# Patient Record
Sex: Female | Born: 1970 | Race: White | Hispanic: No | Marital: Married | Smoking: Former smoker
Health system: Southern US, Community
[De-identification: ages and names within clinical notes are randomized; demographics above are authoritative.]

## PROBLEM LIST (undated history)

## (undated) DIAGNOSIS — M549 Dorsalgia, unspecified: Secondary | ICD-10-CM

## (undated) DIAGNOSIS — G8929 Other chronic pain: Secondary | ICD-10-CM

## (undated) DIAGNOSIS — K5792 Diverticulitis of intestine, part unspecified, without perforation or abscess without bleeding: Secondary | ICD-10-CM

## (undated) DIAGNOSIS — Z8489 Family history of other specified conditions: Secondary | ICD-10-CM

## (undated) DIAGNOSIS — R1115 Cyclical vomiting syndrome unrelated to migraine: Secondary | ICD-10-CM

## (undated) DIAGNOSIS — K649 Unspecified hemorrhoids: Secondary | ICD-10-CM

## (undated) HISTORY — PX: HEMORRHOID SURGERY: SHX153

## (undated) HISTORY — PX: APPENDECTOMY: SHX54

## (undated) HISTORY — PX: CHOLECYSTECTOMY: SHX55

## (undated) HISTORY — PX: BACK SURGERY: SHX140

## (undated) HISTORY — PX: ABDOMINAL HYSTERECTOMY: SHX81

---
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
## 2003-11-08 ENCOUNTER — Other Ambulatory Visit: Admission: RE | Admit: 2003-11-08 | Discharge: 2003-11-08 | Payer: Self-pay | Admitting: *Deleted

## 2003-12-07 ENCOUNTER — Emergency Department (HOSPITAL_COMMUNITY): Admission: EM | Admit: 2003-12-07 | Discharge: 2003-12-07 | Payer: Self-pay | Admitting: *Deleted

## 2004-01-03 ENCOUNTER — Emergency Department (HOSPITAL_COMMUNITY): Admission: EM | Admit: 2004-01-03 | Discharge: 2004-01-03 | Payer: Self-pay | Admitting: Emergency Medicine

## 2004-06-22 ENCOUNTER — Emergency Department (HOSPITAL_COMMUNITY): Admission: EM | Admit: 2004-06-22 | Discharge: 2004-06-22 | Payer: Self-pay | Admitting: Family Medicine

## 2005-06-25 ENCOUNTER — Inpatient Hospital Stay (HOSPITAL_COMMUNITY): Admission: RE | Admit: 2005-06-25 | Discharge: 2005-06-26 | Payer: Self-pay | Admitting: *Deleted

## 2005-06-27 ENCOUNTER — Inpatient Hospital Stay (HOSPITAL_COMMUNITY): Admission: AD | Admit: 2005-06-27 | Discharge: 2005-06-29 | Payer: Self-pay | Admitting: Obstetrics and Gynecology

## 2005-07-25 ENCOUNTER — Observation Stay (HOSPITAL_COMMUNITY): Admission: EM | Admit: 2005-07-25 | Discharge: 2005-07-25 | Payer: Self-pay | Admitting: Emergency Medicine

## 2007-03-09 IMAGING — CR DG ABDOMEN ACUTE W/ 1V CHEST
3 series · 3 of 3 positions shown · non-contrast
Comparison: None.

CLINICAL DATA: 34-year-old female status-post hysterectomy with vomiting and abdominal pain.  
ACUTE ABDOMINAL SERIES ? 3 VIEW:

[view not recorded (1 of 3)]
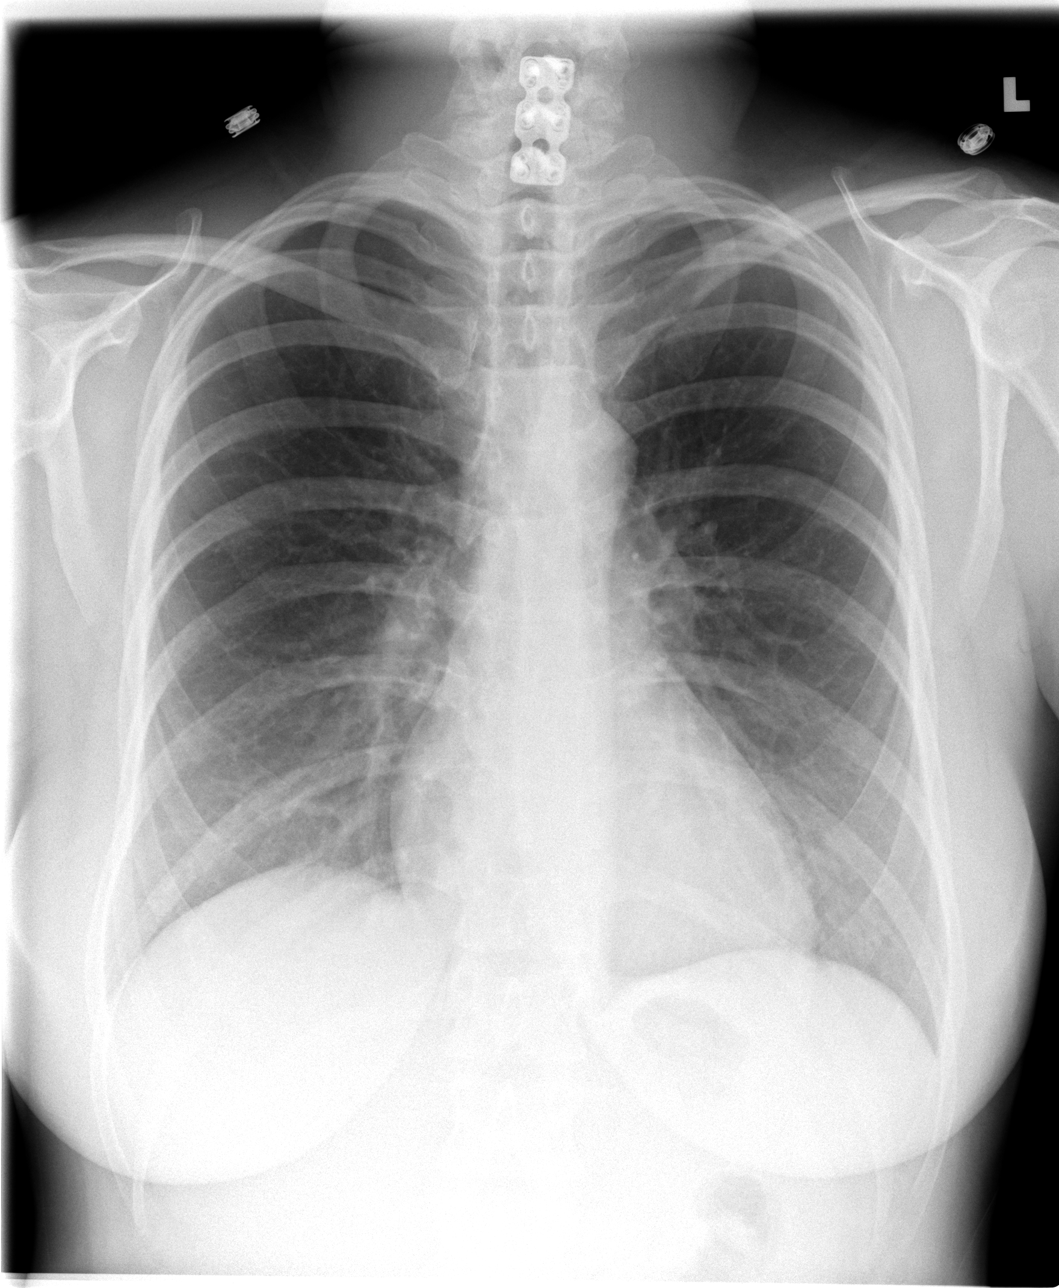

[view not recorded (2 of 3)]
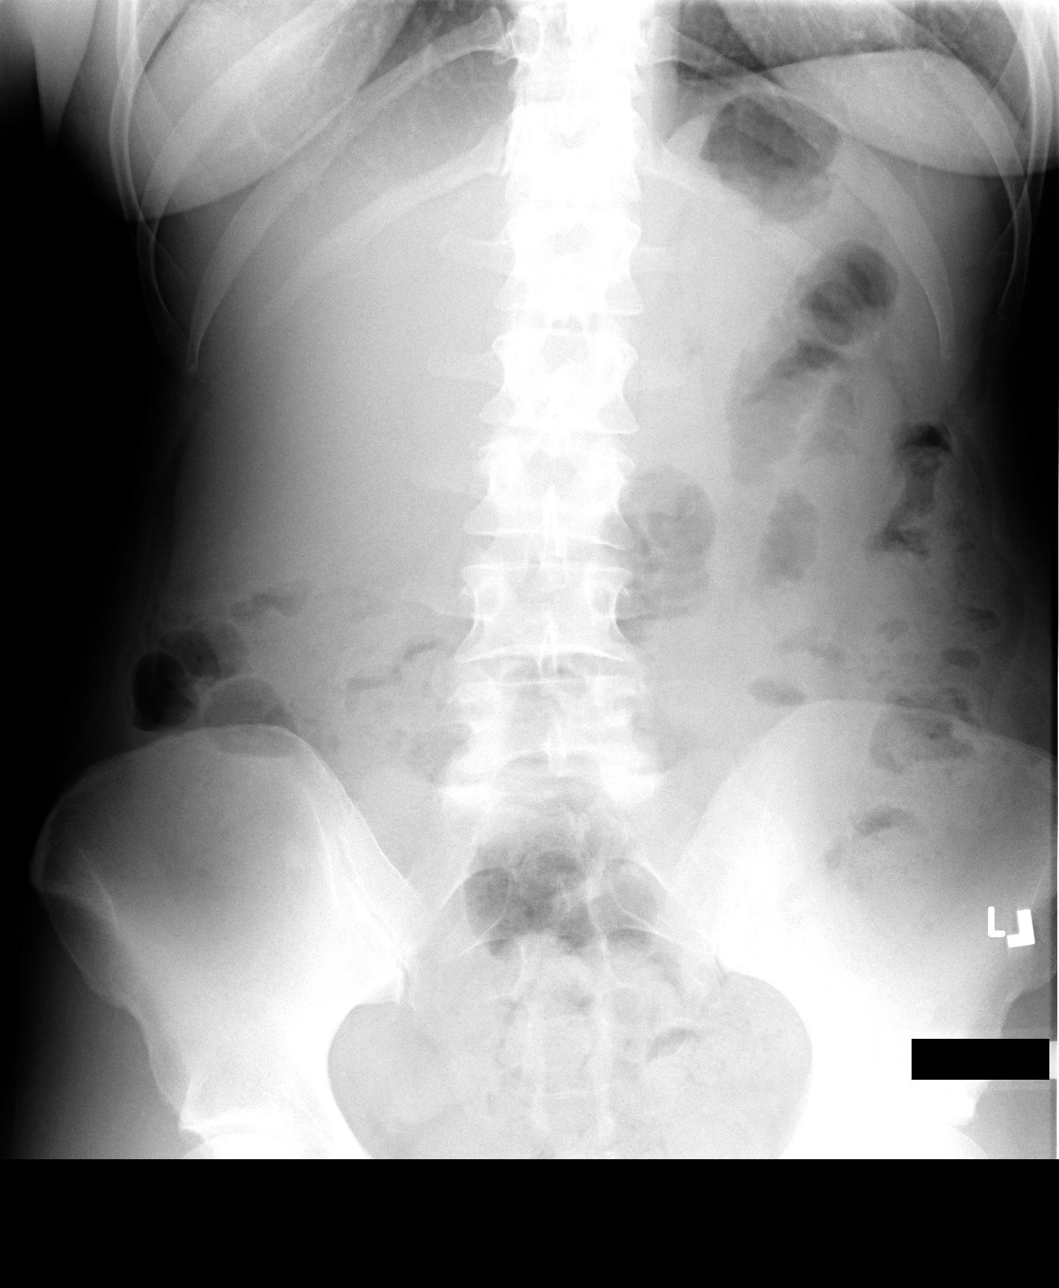

[view not recorded (3 of 3)]
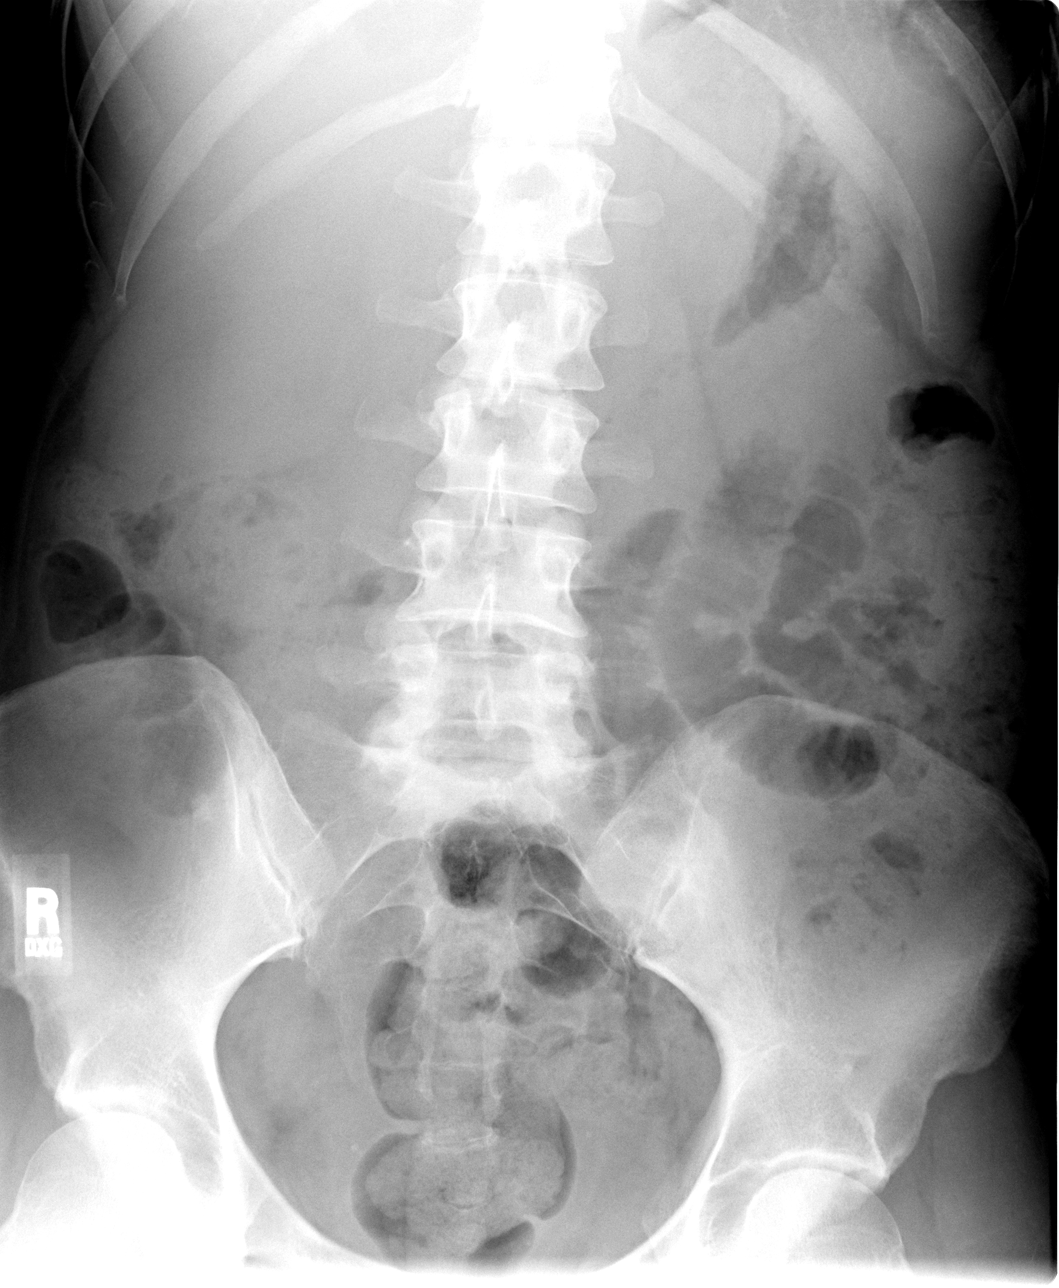

[3 of 3 positions shown; findings below may reference images not displayed]

CHEST:  
Lungs are clear.  Normal heart size.  No visualized free air.  Cervical spinal fusion hardware is noted.  
ABDOMEN:
Scattered air and stool is present throughout the bowel.  Mild distention of the small bowel in the left abdomen.  No definite obstruction.  Mild constipation.
IMPRESSION: 1.  No acute chest finding. 
2.  No obstruction.
3.  Constipation.

## 2013-01-07 ENCOUNTER — Other Ambulatory Visit: Payer: Self-pay | Admitting: Neurosurgery

## 2013-01-07 ENCOUNTER — Ambulatory Visit
Admission: RE | Admit: 2013-01-07 | Discharge: 2013-01-07 | Disposition: A | Discharge status: Home / Self Care | Payer: Medicare Other | Source: Ambulatory Visit | Attending: Neurosurgery | Admitting: Neurosurgery

## 2013-01-07 DIAGNOSIS — M47816 Spondylosis without myelopathy or radiculopathy, lumbar region: Secondary | ICD-10-CM

## 2013-06-22 ENCOUNTER — Other Ambulatory Visit: Payer: Self-pay | Admitting: Neurosurgery

## 2013-06-22 DIAGNOSIS — M542 Cervicalgia: Secondary | ICD-10-CM

## 2013-06-28 ENCOUNTER — Ambulatory Visit
Admission: RE | Admit: 2013-06-28 | Discharge: 2013-06-28 | Disposition: A | Discharge status: Home / Self Care | Payer: Medicare Other | Source: Ambulatory Visit | Attending: Neurosurgery | Admitting: Neurosurgery

## 2013-06-28 DIAGNOSIS — M542 Cervicalgia: Secondary | ICD-10-CM

## 2014-06-04 ENCOUNTER — Emergency Department (HOSPITAL_BASED_OUTPATIENT_CLINIC_OR_DEPARTMENT_OTHER): Payer: Medicare Other

## 2014-06-04 ENCOUNTER — Emergency Department (HOSPITAL_BASED_OUTPATIENT_CLINIC_OR_DEPARTMENT_OTHER)
Admission: EM | Admit: 2014-06-04 | Discharge: 2014-06-04 | Disposition: A | Discharge status: Home / Self Care | Payer: Medicare Other | Attending: Emergency Medicine | Admitting: Emergency Medicine

## 2014-06-04 ENCOUNTER — Encounter (HOSPITAL_BASED_OUTPATIENT_CLINIC_OR_DEPARTMENT_OTHER): Payer: Self-pay | Admitting: Emergency Medicine

## 2014-06-04 DIAGNOSIS — F17200 Nicotine dependence, unspecified, uncomplicated: Secondary | ICD-10-CM | POA: Insufficient documentation

## 2014-06-04 DIAGNOSIS — G8929 Other chronic pain: Secondary | ICD-10-CM | POA: Insufficient documentation

## 2014-06-04 DIAGNOSIS — K5732 Diverticulitis of large intestine without perforation or abscess without bleeding: Secondary | ICD-10-CM

## 2014-06-04 DIAGNOSIS — Z79899 Other long term (current) drug therapy: Secondary | ICD-10-CM | POA: Insufficient documentation

## 2014-06-04 HISTORY — DX: Dorsalgia, unspecified: M54.9

## 2014-06-04 HISTORY — DX: Other chronic pain: G89.29

## 2014-06-04 LAB — BASIC METABOLIC PANEL
BUN: 14 mg/dL (ref 6–23)
CO2: 25 mEq/L (ref 19–32)
Calcium: 9.4 mg/dL (ref 8.4–10.5)
Chloride: 107 mEq/L (ref 96–112)
Creatinine, Ser: 0.6 mg/dL (ref 0.50–1.10)
GFR calc Af Amer: >90 mL/min (ref >90)
GFR calc non Af Amer: >90 mL/min (ref >90)
Glucose, Bld: 107 mg/dL — ABNORMAL HIGH (ref 70–99)
Potassium: 3.6 mEq/L — ABNORMAL LOW (ref 3.7–5.3)
Sodium: 144 mEq/L (ref 137–147)

## 2014-06-04 LAB — URINALYSIS, ROUTINE W REFLEX MICROSCOPIC
Glucose, UA: NEGATIVE mg/dL
Hgb urine dipstick: NEGATIVE
Ketones, ur: 15 mg/dL — AB
Leukocytes, UA: NEGATIVE
Nitrite: NEGATIVE
Protein, ur: NEGATIVE mg/dL
Specific Gravity, Urine: 1.027 (ref 1.005–1.030)
Urobilinogen, UA: 1 mg/dL (ref 0.0–1.0)
pH: 6.5 (ref 5.0–8.0)

## 2014-06-04 LAB — HEPATIC FUNCTION PANEL
ALT: 13 U/L (ref 0–35)
AST: 15 U/L (ref 0–37)
Albumin: 3.9 g/dL (ref 3.5–5.2)
Alkaline Phosphatase: 47 U/L (ref 39–117)
Bilirubin, Direct: <0.2 mg/dL (ref 0.0–0.3)
Total Bilirubin: 0.4 mg/dL (ref 0.3–1.2)
Total Protein: 6.9 g/dL (ref 6.0–8.3)

## 2014-06-04 LAB — CBC WITH DIFFERENTIAL/PLATELET
Basophils Absolute: 0 10*3/uL (ref 0.0–0.1)
Basophils Relative: 0 % (ref 0–1)
Eosinophils Absolute: 0.1 10*3/uL (ref 0.0–0.7)
Eosinophils Relative: 1 % (ref 0–5)
HCT: 36.9 % (ref 36.0–46.0)
Hemoglobin: 12.2 g/dL (ref 12.0–15.0)
Lymphocytes Relative: 16 % (ref 12–46)
Lymphs Abs: 2.2 10*3/uL (ref 0.7–4.0)
MCH: 32.7 pg (ref 26.0–34.0)
MCHC: 33.1 g/dL (ref 30.0–36.0)
MCV: 98.9 fL (ref 78.0–100.0)
Monocytes Absolute: 1 10*3/uL (ref 0.1–1.0)
Monocytes Relative: 7 % (ref 3–12)
Neutro Abs: 10.5 10*3/uL — ABNORMAL HIGH (ref 1.7–7.7)
Neutrophils Relative %: 76 % (ref 43–77)
Platelets: 296 10*3/uL (ref 150–400)
RBC: 3.73 MIL/uL — ABNORMAL LOW (ref 3.87–5.11)
RDW: 14.4 % (ref 11.5–15.5)
WBC: 13.8 10*3/uL — ABNORMAL HIGH (ref 4.0–10.5)

## 2014-06-04 MED ORDER — HYDROCODONE-ACETAMINOPHEN 5-325 MG PO TABS
1.0000 | ORAL_TABLET | Freq: Four times a day (QID) | ORAL | Status: DC | PRN
Start: 2014-06-04 — End: 2014-11-14

## 2014-06-04 MED ORDER — KETOROLAC TROMETHAMINE 30 MG/ML IJ SOLN
30.0000 mg | Freq: Once | INTRAMUSCULAR | Status: AC
  Administered 2014-06-04: 30 mg via INTRAVENOUS
  Filled 2014-06-04: qty 1

## 2014-06-04 MED ORDER — METRONIDAZOLE 500 MG PO TABS
500.0000 mg | ORAL_TABLET | Freq: Two times a day (BID) | ORAL | Status: DC
Start: 2014-06-04 — End: 2014-11-14

## 2014-06-04 MED ORDER — CIPROFLOXACIN IN D5W 400 MG/200ML IV SOLN
400.0000 mg | Freq: Once | INTRAVENOUS | Status: AC
  Administered 2014-06-04: 400 mg via INTRAVENOUS
  Filled 2014-06-04: qty 200

## 2014-06-04 MED ORDER — SODIUM CHLORIDE 0.9 % IV BOLUS (SEPSIS)
1000.0000 mL | Freq: Once | INTRAVENOUS | Status: AC
  Administered 2014-06-04: 1000 mL via INTRAVENOUS

## 2014-06-04 MED ORDER — HYDROMORPHONE HCL PF 1 MG/ML IJ SOLN
0.5000 mg | Freq: Once | INTRAMUSCULAR | Status: AC
  Administered 2014-06-04: 0.5 mg via INTRAVENOUS
  Filled 2014-06-04: qty 1

## 2014-06-04 MED ORDER — CIPROFLOXACIN HCL 500 MG PO TABS
500.0000 mg | ORAL_TABLET | Freq: Two times a day (BID) | ORAL | Status: DC
Start: 2014-06-04 — End: 2014-11-14

## 2014-06-04 MED ORDER — METRONIDAZOLE IN NACL 5-0.79 MG/ML-% IV SOLN
500.0000 mg | Freq: Once | INTRAVENOUS | Status: AC
  Administered 2014-06-04: 500 mg via INTRAVENOUS
  Filled 2014-06-04: qty 100

## 2014-06-04 MED ORDER — HYDROMORPHONE HCL PF 1 MG/ML IJ SOLN
0.5000 mg | INTRAMUSCULAR | Status: DC | PRN
  Administered 2014-06-04: 0.5 mg via INTRAVENOUS
  Filled 2014-06-04: qty 1

## 2014-06-04 NOTE — ED Notes (Signed)
Lower abdominal pain woke her at 4am. States it feels like a pressure. No nausea no vomiting. Denies vaginal discharge.

## 2014-06-04 NOTE — ED Provider Notes (Signed)
CSN: [id number]     Arrival date & time 06/04/14  1352 History   First MD Initiated Contact with Patient 06/04/14 1413     Chief Complaint  Patient presents with  . Abdominal Pain      HPI  Patient presents with acute onset of lower abdominal pain.  The pain is focally in the mid to lower abdomen.  Pain is sore, severe, nonradiating. There is no associated vomiting, diarrhea, vaginal discharge or bleeding. No relief with OTC medication. Pain is incapacitating. No clear precipitant. No associated chest pain, dyspnea. Patient has history of total abdominal hysterectomy, cholecystectomy, appendectomy.   Past Medical History  Diagnosis Date  . Chronic back pain    Past Surgical History  Procedure Laterality Date  . Abdominal hysterectomy    . Back surgery     No family history on file. History  Substance Use Topics  . Smoking status: Current Every Day Smoker -- 1.00 packs/day    Types: Cigarettes  . Smokeless tobacco: Not on file  . Alcohol Use: No   OB History   Grav Para Term Preterm Abortions TAB SAB Ect Mult Living                 Review of Systems  Constitutional:       Per HPI, otherwise negative  HENT:       Per HPI, otherwise negative  Respiratory:       Per HPI, otherwise negative  Cardiovascular:       Per HPI, otherwise negative  Gastrointestinal: Negative for vomiting.  Endocrine:       Negative aside from HPI  Genitourinary:       Neg aside from HPI   Musculoskeletal:       Per HPI, otherwise negative  Skin: Negative.   Neurological: Negative for syncope.      Allergies  Sulfa antibiotics and Biaxin  Home Medications   Prior to Admission medications   Medication Sig Start Date End Date Taking? Authorizing Nahshon Reich  Cyclobenzaprine HCl (FLEXERIL PO) Take by mouth.   Yes Historical Clide Remmers, MD  Hydrocodone-Acetaminophen (VICODIN PO) Take by mouth.   Yes Historical Zaccheus Edmister, MD  Naproxen (NAPROSYN PO) Take by mouth.   Yes Historical  Aela Bohan, MD   BP 117/82  Pulse 101  Temp(Src) 99.5 F (37.5 C) (Oral)  Resp 20  Ht 5\' 6"  (1.676 m)  Wt 172 lb (78.019 kg)  BMI 27.77 kg/m2  SpO2 98% Physical Exam  Nursing note and vitals reviewed. Constitutional: She is oriented to person, place, and time. She appears well-developed and well-nourished. No distress.  HENT:  Head: Normocephalic and atraumatic.  Eyes: Conjunctivae and EOM are normal.  Cardiovascular: Normal rate and regular rhythm.   Pulmonary/Chest: Effort normal and breath sounds normal. No stridor. No respiratory distress.  Abdominal: She exhibits no distension. There is tenderness in the suprapubic area. There is guarding. There is no rigidity and no rebound.    Musculoskeletal: She exhibits no edema.  Neurological: She is alert and oriented to person, place, and time. No cranial nerve deficit.  Skin: Skin is warm and dry.  Psychiatric: She has a normal mood and affect.    ED Course  Procedures (including critical care time) Labs Review Labs Reviewed  URINALYSIS, ROUTINE W REFLEX MICROSCOPIC - Abnormal; Notable for the following:    Color, Urine AMBER (*)    APPearance CLOUDY (*)    Bilirubin Urine SMALL (*)    Ketones, ur 15 (*)  All other components within normal limits  CBC WITH DIFFERENTIAL - Abnormal; Notable for the following:    WBC 13.8 (*)    RBC 3.73 (*)    Neutro Abs 10.5 (*)    All other components within normal limits  BASIC METABOLIC PANEL  HEPATIC FUNCTION PANEL    Imaging Review Ct Abdomen Pelvis Wo Contrast  06/04/2014   CLINICAL DATA:  43 year old female with abdominal and pelvic pain and pressure.  EXAM: CT ABDOMEN AND PELVIS WITHOUT CONTRAST  TECHNIQUE: Multidetector CT imaging of the abdomen and pelvis was performed following the standard protocol without IV contrast.  COMPARISON:  06/28/2005 CT  FINDINGS: Bilateral breast prosthesis and mild bibasilar pulmonary scarring noted.  The liver, spleen, adrenal glands, kidneys and  pancreas are unremarkable.  Please note that parenchymal abnormalities may be missed without intravenous contrast.  The patient is status post cholecystectomy and hysterectomy.  Mild focal wall thickening of the mid sigmoid colon with adjacent inflammation is compatible with diverticulitis. No definite focal collection/abscess, pneumoperitoneum or bowel obstruction identified.  Scattered diverticula throughout the transverse, descending and sigmoid colon identified.  There is no evidence of free fluid, enlarged lymph nodes, biliary dilation or abdominal aortic aneurysm.  Posterior lumbar spine fusion changes and hardware identified from L2-S1.  No acute bony abnormalities are identified.  IMPRESSION: Diverticulitis involving the mid sigmoid colon. No evidence of focal abscess or pneumoperitoneum. Clinical followup recommended.   Electronically Signed   By: Laveda AbbeJeff  Hu M.D.   On: 06/04/2014 15:05   3:20 PM On repeat exam the patient appears more calm, pain has improved substantially. Discuss all results.  Patient will receive initial hematocrit here, but discharged to followup with GI. MDM  Patient presents no lower abdominal pain, is found to have diverticulitis.  Patient afebrile, in no distress, had pain controlled here.  The patient was started on antibiotics, discharged to follow up with GI.    Gerhard Munchobert Lockwood, MD 06/04/14 1520

## 2014-06-04 NOTE — Discharge Instructions (Signed)
Please be sure to take all medication as directed, and follow up with our gastroenterologist after he completes her antibiotics.  Return here for concerning changes in your condition.   Diverticulitis Diverticulitis is inflammation or infection of small pouches in your colon that form when you have a condition called diverticulosis. The pouches in your colon are called diverticula. Your colon, or large intestine, is where water is absorbed and stool is formed. Complications of diverticulitis can include:  Bleeding.  Severe infection.  Severe pain.  Perforation of your colon.  Obstruction of your colon. CAUSES  Diverticulitis is caused by bacteria. Diverticulitis happens when stool becomes trapped in diverticula. This allows bacteria to grow in the diverticula, which can lead to inflammation and infection. RISK FACTORS People with diverticulosis are at risk for diverticulitis. Eating a diet that does not include enough fiber from fruits and vegetables may make diverticulitis more likely to develop. SYMPTOMS  Symptoms of diverticulitis may include:  Abdominal pain and tenderness. The pain is normally located on the left side of the abdomen, but may occur in other areas.  Fever and chills.  Bloating.  Cramping.  Nausea.  Vomiting.  Constipation.  Diarrhea.  Blood in your stool. DIAGNOSIS  Your health care provider will ask you about your medical history and do a physical exam. You may need to have tests done because many medical conditions can cause the same symptoms as diverticulitis. Tests may include:  Blood tests.  Urine tests.  Imaging tests of the abdomen, including X-rays and CT scans. When your condition is under control, your health care provider may recommend that you have a colonoscopy. A colonoscopy can show how severe your diverticula are and whether something else is causing your symptoms. TREATMENT  Most cases of diverticulitis are mild and can be  treated at home. Treatment may include:  Taking over-the-counter pain medicines.  Following a clear liquid diet.  Taking antibiotic medicines by mouth for 7-10 days. More severe cases may be treated at a hospital. Treatment may include:  Not eating or drinking.  Taking prescription pain medicine.  Receiving antibiotic medicines through an IV tube.  Receiving fluids and nutrition through an IV tube.  Surgery. HOME CARE INSTRUCTIONS   Follow your health care provider's instructions carefully.  Follow a full liquid diet or other diet as directed by your health care provider. After your symptoms improve, your health care provider may tell you to change your diet. He or she may recommend you eat a high-fiber diet. Fruits and vegetables are good sources of fiber. Fiber makes it easier to pass stool.  Take fiber supplements or probiotics as directed by your health care provider.  Only take medicines as directed by your health care provider.  Keep all your follow-up appointments. SEEK MEDICAL CARE IF:   Your pain does not improve.  You have a hard time eating food.  Your bowel movements do not return to normal. SEEK IMMEDIATE MEDICAL CARE IF:   Your pain becomes worse.  Your symptoms do not get better.  Your symptoms suddenly get worse.  You have a fever.  You have repeated vomiting.  You have bloody or black, tarry stools. MAKE SURE YOU:   Understand these instructions.  Will watch your condition.  Will get help right away if you are not doing well or get worse. Document Released: 09/12/2005 Document Revised: 12/08/2013 Document Reviewed: 10/28/2013 [facility] Patient Information 2015 [employer] [place]. This information is not intended to replace advice given to you by  your health care provider. Make sure you discuss any questions you have with your health care provider. ° °

## 2014-08-03 ENCOUNTER — Emergency Department (HOSPITAL_BASED_OUTPATIENT_CLINIC_OR_DEPARTMENT_OTHER)
Admission: EM | Admit: 2014-08-03 | Discharge: 2014-08-04 | Disposition: A | Discharge status: Home / Self Care | Payer: Medicare Other | Attending: Emergency Medicine | Admitting: Emergency Medicine

## 2014-08-03 ENCOUNTER — Encounter (HOSPITAL_BASED_OUTPATIENT_CLINIC_OR_DEPARTMENT_OTHER): Payer: Self-pay | Admitting: Emergency Medicine

## 2014-08-03 ENCOUNTER — Emergency Department (HOSPITAL_BASED_OUTPATIENT_CLINIC_OR_DEPARTMENT_OTHER): Payer: Medicare Other

## 2014-08-03 DIAGNOSIS — Z792 Long term (current) use of antibiotics: Secondary | ICD-10-CM | POA: Insufficient documentation

## 2014-08-03 DIAGNOSIS — R1032 Left lower quadrant pain: Secondary | ICD-10-CM | POA: Diagnosis not present

## 2014-08-03 DIAGNOSIS — F17200 Nicotine dependence, unspecified, uncomplicated: Secondary | ICD-10-CM | POA: Diagnosis not present

## 2014-08-03 DIAGNOSIS — Z90710 Acquired absence of both cervix and uterus: Secondary | ICD-10-CM | POA: Diagnosis not present

## 2014-08-03 DIAGNOSIS — Z791 Long term (current) use of non-steroidal anti-inflammatories (NSAID): Secondary | ICD-10-CM | POA: Insufficient documentation

## 2014-08-03 DIAGNOSIS — G8929 Other chronic pain: Secondary | ICD-10-CM | POA: Insufficient documentation

## 2014-08-03 LAB — URINALYSIS, ROUTINE W REFLEX MICROSCOPIC
BILIRUBIN URINE: NEGATIVE
Glucose, UA: NEGATIVE mg/dL
Hgb urine dipstick: NEGATIVE
Ketones, ur: NEGATIVE mg/dL
Leukocytes, UA: NEGATIVE
Nitrite: NEGATIVE
Protein, ur: NEGATIVE mg/dL
Specific Gravity, Urine: 1.024 (ref 1.005–1.030)
UROBILINOGEN UA: 0.2 mg/dL (ref 0.0–1.0)
pH: 6.5 (ref 5.0–8.0)

## 2014-08-03 LAB — CBC WITH DIFFERENTIAL/PLATELET
BASOS ABS: 0 10*3/uL (ref 0.0–0.1)
Basophils Relative: 0 % (ref 0–1)
Eosinophils Absolute: 0.2 10*3/uL (ref 0.0–0.7)
Eosinophils Relative: 2 % (ref 0–5)
HCT: 38.5 % (ref 36.0–46.0)
Hemoglobin: 12.5 g/dL (ref 12.0–15.0)
Lymphocytes Relative: 33 % (ref 12–46)
Lymphs Abs: 2.5 10*3/uL (ref 0.7–4.0)
MCH: 33 pg (ref 26.0–34.0)
MCHC: 32.5 g/dL (ref 30.0–36.0)
MCV: 101.6 fL — ABNORMAL HIGH (ref 78.0–100.0)
MONO ABS: 0.7 10*3/uL (ref 0.1–1.0)
Monocytes Relative: 10 % (ref 3–12)
Neutro Abs: 4.3 10*3/uL (ref 1.7–7.7)
Neutrophils Relative %: 56 % (ref 43–77)
Platelets: 304 10*3/uL (ref 150–400)
RBC: 3.79 MIL/uL — ABNORMAL LOW (ref 3.87–5.11)
RDW: 13.6 % (ref 11.5–15.5)
WBC: 7.7 10*3/uL (ref 4.0–10.5)

## 2014-08-03 MED ORDER — SODIUM CHLORIDE 0.9 % IV BOLUS (SEPSIS)
1000.0000 mL | Freq: Once | INTRAVENOUS | Status: AC
  Administered 2014-08-03: 1000 mL via INTRAVENOUS

## 2014-08-03 MED ORDER — METRONIDAZOLE 500 MG PO TABS
500.0000 mg | ORAL_TABLET | Freq: Once | ORAL | Status: AC
  Administered 2014-08-03: 500 mg via ORAL
  Filled 2014-08-03: qty 1

## 2014-08-03 MED ORDER — HYDROMORPHONE HCL PF 1 MG/ML IJ SOLN
1.0000 mg | Freq: Once | INTRAMUSCULAR | Status: AC
  Administered 2014-08-03: 1 mg via INTRAVENOUS
  Filled 2014-08-03: qty 1

## 2014-08-03 MED ORDER — ONDANSETRON HCL 4 MG/2ML IJ SOLN
4.0000 mg | Freq: Once | INTRAMUSCULAR | Status: AC
  Administered 2014-08-03: 4 mg via INTRAVENOUS
  Filled 2014-08-03: qty 2

## 2014-08-03 MED ORDER — IOHEXOL 300 MG/ML  SOLN
50.0000 mL | Freq: Once | INTRAMUSCULAR | Status: AC | PRN
  Administered 2014-08-04: 50 mL via ORAL

## 2014-08-03 MED ORDER — IOHEXOL 300 MG/ML  SOLN
100.0000 mL | Freq: Once | INTRAMUSCULAR | Status: AC | PRN
  Administered 2014-08-04: 100 mL via INTRAVENOUS

## 2014-08-03 MED ORDER — CIPROFLOXACIN HCL 500 MG PO TABS
500.0000 mg | ORAL_TABLET | Freq: Once | ORAL | Status: AC
  Administered 2014-08-03: 500 mg via ORAL
  Filled 2014-08-03: qty 1

## 2014-08-03 NOTE — ED Notes (Signed)
Pt says that she had a colonoscopy yesterday, was told she had diverticulitis and given abx. She says she has increasing pain and pressure in lower left abdomen with vomiting through the day.

## 2014-08-03 NOTE — ED Provider Notes (Signed)
CSN: [id number]     Arrival date & time 08/03/14  2048 History   This chart was scribed for [name] Smitty CordsK [name], MD by Milly JakobJohn Lee Graves, ED Scribe. The patient was seen in room MH09/MH09. Patient's care was started at 11:25 PM.   Chief Complaint  Patient presents with  . Abdominal Pain    Patient is a 43 y.o. female presenting with abdominal pain. The history is provided by the patient. No language interpreter was used.  Abdominal Pain Pain location:  LLQ Pain quality: pressure and sharp   Pain radiates to:  Does not radiate Pain severity:  Severe Onset quality:  Sudden Timing:  Constant Progression:  Unchanged Chronicity:  New Context: not alcohol use   Context comment:  Colonscopy yesterday Relieved by:  Nothing Worsened by:  Nothing tried Ineffective treatments:  Palpation, movement and NSAIDs Associated symptoms: no constipation, no diarrhea, no fever, no nausea and no vomiting   Risk factors: no alcohol abuse and not pregnant   HPI Comments: Laura Ho is a 43 y.o. female who presents to the Emergency Department complaining of pressure and pain in her left lower abdomen. [place] and is taking her doxycycline once per day. She reports taking percocet at 4 PM this afternoon. She reports that she has 20 of these left over from her back doctor.    Past Medical History  Diagnosis Date  . Chronic back pain    Past Surgical History  Procedure Laterality Date  . Abdominal hysterectomy    . Back surgery     No family history on file. History  Substance Use Topics  . Smoking status: Current Every Day Smoker -- 1.00 packs/day    Types: Cigarettes  . Smokeless tobacco: Not on file  . Alcohol Use: No   OB History   Grav Para Term Preterm Abortions TAB SAB Ect Mult Living                 Review of Systems  Constitutional: Negative for fever.  Gastrointestinal: Positive for abdominal pain. Negative  for nausea, vomiting, diarrhea and constipation.  All other systems reviewed and are negative.  Allergies  Sulfa antibiotics and Biaxin  Home Medications   Prior to Admission medications   Medication Sig Start Date End Date Taking? Authorizing Provider  doxycycline (DORYX) 100 MG DR capsule Take 100 mg by mouth daily.   Yes Historical Provider, MD  oxyCODONE-acetaminophen (PERCOCET) 7.5-325 MG per tablet Take 1 tablet by mouth every 4 (four) hours as needed for pain.   Yes Historical Provider, MD  ciprofloxacin (CIPRO) 500 MG tablet Take 1 tablet (500 mg total) by mouth 2 (two) times daily. 06/04/14   Gerhard Munchobert Lockwood, MD  Cyclobenzaprine HCl (FLEXERIL PO) Take by mouth.    Historical Provider, MD  HYDROcodone-acetaminophen (NORCO/VICODIN) 5-325 MG per tablet Take 1 tablet by mouth every 6 (six) hours as needed for moderate pain or severe pain. 06/04/14   Gerhard Munchobert Lockwood, MD  Hydrocodone-Acetaminophen (VICODIN PO) Take by mouth.    Historical Provider, MD  metroNIDAZOLE (FLAGYL) 500 MG tablet Take 1 tablet (500 mg total) by mouth 2 (two) times daily. 06/04/14   Gerhard Munchobert Lockwood, MD  Naproxen (NAPROSYN PO) Take by mouth.    Historical Provider, MD   Triage Vitals: BP 145/94  Pulse 100  Temp(Src) 99.6 F (37.6 C) (Oral)  Resp 18  Ht 5\' 6"  (1.676 m)  Wt 165 lb (74.844 kg)  BMI 26.64 kg/m2  SpO2 99% Physical Exam  Nursing note and vitals reviewed. Constitutional: She is oriented to person, place, and time. She appears well-developed and well-nourished. No distress.  HENT:  Head: Normocephalic and atraumatic.  Mouth/Throat: Oropharynx is clear and moist.  Eyes: Conjunctivae and EOM are normal. Pupils are equal, round, and reactive to light.  Neck: Normal range of motion. Neck supple. No tracheal deviation present.  Cardiovascular: Normal rate, regular rhythm and normal heart sounds.   No murmur heard. Pulmonary/Chest: Effort normal and breath sounds normal. No respiratory distress. She  has no wheezes. She has no rales.  Abdominal: Soft. She exhibits no distension. There is tenderness (LLQ). There is no rebound and no guarding.  Hyperactive bowel sounds.  Musculoskeletal: Normal range of motion.  Neurological: She is alert and oriented to person, place, and time. She displays normal reflexes.  Skin: Skin is warm and dry.  Psychiatric: She has a normal mood and affect. Her behavior is normal.    ED Course  Procedures (including critical care time)  11:30 PM-Discussed treatment plan which includes pain medication with pt at bedside and pt agreed to plan.   Labs Review Labs Reviewed  URINALYSIS, ROUTINE W REFLEX MICROSCOPIC  CBC WITH DIFFERENTIAL  COMPREHENSIVE METABOLIC PANEL    Imaging Review No results found.   EKG Interpretation None      MDM   Final diagnoses:  None    Normal labs and CT.  Suspect pain is related to gas and pressure from the colonscopy process itself.  Will cover with 7 days of cipro flagyl to ensure no infection but no diverticulitis.  Has many percocet at home with add meloxicam, follow up with your PMD for recheck in 48 hours.  Patient verbalizes understanding and agrees to follow up  I personally performed the services described in this documentation, which was scribed in my presence. The recorded information has been reviewed and is accurate.     Laura Awe, MD 08/04/14 [phone]

## 2014-08-03 NOTE — ED Notes (Signed)
abd pain onset yesterday  Had colonoscopy  Is on antibiotic  But pain is getting worse   Infection was found

## 2014-08-04 ENCOUNTER — Encounter (HOSPITAL_BASED_OUTPATIENT_CLINIC_OR_DEPARTMENT_OTHER): Payer: Self-pay | Admitting: Emergency Medicine

## 2014-08-04 LAB — COMPREHENSIVE METABOLIC PANEL
ALT: 11 U/L (ref 0–35)
AST: 19 U/L (ref 0–37)
Albumin: 3.8 g/dL (ref 3.5–5.2)
Alkaline Phosphatase: 64 U/L (ref 39–117)
Anion gap: 13 (ref 5–15)
BUN: 15 mg/dL (ref 6–23)
CHLORIDE: 109 meq/L (ref 96–112)
CO2: 23 meq/L (ref 19–32)
CREATININE: 0.6 mg/dL (ref 0.50–1.10)
Calcium: 9.5 mg/dL (ref 8.4–10.5)
GFR calc Af Amer: >90 mL/min (ref >90)
GLUCOSE: 104 mg/dL — AB (ref 70–99)
Potassium: 4 mEq/L (ref 3.7–5.3)
Sodium: 145 mEq/L (ref 137–147)
Total Protein: 7.2 g/dL (ref 6.0–8.3)

## 2014-08-04 MED ORDER — MELOXICAM 7.5 MG PO TABS: 7.5000 mg | ORAL_TABLET | Freq: Every day | ORAL | Status: DC

## 2014-08-04 MED ORDER — CIPROFLOXACIN HCL 500 MG PO TABS: 500.0000 mg | ORAL_TABLET | Freq: Two times a day (BID) | ORAL | Status: DC

## 2014-08-04 MED ORDER — METRONIDAZOLE 500 MG PO TABS: 500.0000 mg | ORAL_TABLET | Freq: Three times a day (TID) | ORAL | Status: DC

## 2014-08-04 NOTE — Discharge Instructions (Signed)

## 2014-11-13 ENCOUNTER — Encounter (HOSPITAL_COMMUNITY): Payer: Self-pay

## 2014-11-13 DIAGNOSIS — Z8719 Personal history of other diseases of the digestive system: Secondary | ICD-10-CM | POA: Diagnosis not present

## 2014-11-13 DIAGNOSIS — R112 Nausea with vomiting, unspecified: Secondary | ICD-10-CM | POA: Insufficient documentation

## 2014-11-13 DIAGNOSIS — G8929 Other chronic pain: Secondary | ICD-10-CM | POA: Diagnosis not present

## 2014-11-13 DIAGNOSIS — Z90710 Acquired absence of both cervix and uterus: Secondary | ICD-10-CM | POA: Diagnosis not present

## 2014-11-13 DIAGNOSIS — R197 Diarrhea, unspecified: Secondary | ICD-10-CM | POA: Insufficient documentation

## 2014-11-13 DIAGNOSIS — Z791 Long term (current) use of non-steroidal anti-inflammatories (NSAID): Secondary | ICD-10-CM | POA: Diagnosis not present

## 2014-11-13 DIAGNOSIS — Z720 Tobacco use: Secondary | ICD-10-CM | POA: Insufficient documentation

## 2014-11-13 DIAGNOSIS — Z8669 Personal history of other diseases of the nervous system and sense organs: Secondary | ICD-10-CM | POA: Insufficient documentation

## 2014-11-13 DIAGNOSIS — Z8739 Personal history of other diseases of the musculoskeletal system and connective tissue: Secondary | ICD-10-CM | POA: Diagnosis not present

## 2014-11-13 DIAGNOSIS — R109 Unspecified abdominal pain: Secondary | ICD-10-CM | POA: Diagnosis present

## 2014-11-13 LAB — CBC WITH DIFFERENTIAL/PLATELET
BASOS ABS: 0 10*3/uL (ref 0.0–0.1)
Basophils Relative: 0 % (ref 0–1)
EOS PCT: 0 % (ref 0–5)
Eosinophils Absolute: 0 10*3/uL (ref 0.0–0.7)
HCT: 43.2 % (ref 36.0–46.0)
Hemoglobin: 14.4 g/dL (ref 12.0–15.0)
LYMPHS PCT: 24 % (ref 12–46)
Lymphs Abs: 2.2 10*3/uL (ref 0.7–4.0)
MCH: 31.7 pg (ref 26.0–34.0)
MCHC: 33.3 g/dL (ref 30.0–36.0)
MCV: 95.2 fL (ref 78.0–100.0)
Monocytes Absolute: 0.9 10*3/uL (ref 0.1–1.0)
Monocytes Relative: 10 % (ref 3–12)
NEUTROS PCT: 66 % (ref 43–77)
Neutro Abs: 6.1 10*3/uL (ref 1.7–7.7)
PLATELETS: 321 10*3/uL (ref 150–400)
RBC: 4.54 MIL/uL (ref 3.87–5.11)
RDW: 14 % (ref 11.5–15.5)
WBC: 9.3 10*3/uL (ref 4.0–10.5)

## 2014-11-13 LAB — COMPREHENSIVE METABOLIC PANEL
ALK PHOS: 70 U/L (ref 39–117)
ALT: 22 U/L (ref 0–35)
AST: 28 U/L (ref 0–37)
Albumin: 4.2 g/dL (ref 3.5–5.2)
Anion gap: 17 — ABNORMAL HIGH (ref 5–15)
BUN: 21 mg/dL (ref 6–23)
CO2: 22 meq/L (ref 19–32)
Calcium: 9.8 mg/dL (ref 8.4–10.5)
Chloride: 99 mEq/L (ref 96–112)
Creatinine, Ser: 0.52 mg/dL (ref 0.50–1.10)
GFR calc Af Amer: >90 mL/min (ref >90)
GFR calc non Af Amer: >90 mL/min (ref >90)
Glucose, Bld: 126 mg/dL — ABNORMAL HIGH (ref 70–99)
POTASSIUM: 3.5 meq/L — AB (ref 3.7–5.3)
SODIUM: 138 meq/L (ref 137–147)
TOTAL PROTEIN: 7.7 g/dL (ref 6.0–8.3)
Total Bilirubin: 0.6 mg/dL (ref 0.3–1.2)

## 2014-11-13 LAB — LIPASE, BLOOD: Lipase: 65 U/L — ABNORMAL HIGH (ref 11–59)

## 2014-11-13 MED ORDER — ONDANSETRON 4 MG PO TBDP
8.0000 mg | ORAL_TABLET | Freq: Once | ORAL | Status: AC
  Administered 2014-11-13: 8 mg via ORAL
  Filled 2014-11-13: qty 2

## 2014-11-13 MED ORDER — FENTANYL CITRATE 0.05 MG/ML IJ SOLN
50.0000 ug | Freq: Once | INTRAMUSCULAR | Status: AC
  Administered 2014-11-13: 50 ug via NASAL
  Filled 2014-11-13: qty 2

## 2014-11-13 NOTE — ED Notes (Signed)
Pt presents with lower abd pain, nausea, vomiting, and diarrhea starting Thursday am. Pt reports a hx of [facility] and diverticulosis, pt denies any blood in her stool. Pt states she is unable to eat or drink since Thursday am.

## 2014-11-14 ENCOUNTER — Emergency Department (HOSPITAL_COMMUNITY)
Admission: EM | Admit: 2014-11-14 | Discharge: 2014-11-14 | Disposition: A | Discharge status: Home / Self Care | Payer: Medicare Other | Attending: Emergency Medicine | Admitting: Emergency Medicine

## 2014-11-14 DIAGNOSIS — R109 Unspecified abdominal pain: Secondary | ICD-10-CM

## 2014-11-14 DIAGNOSIS — G8929 Other chronic pain: Secondary | ICD-10-CM

## 2014-11-14 DIAGNOSIS — R112 Nausea with vomiting, unspecified: Secondary | ICD-10-CM

## 2014-11-14 HISTORY — DX: Diverticulitis of intestine, part unspecified, without perforation or abscess without bleeding: K57.92

## 2014-11-14 HISTORY — DX: Cyclical vomiting syndrome unrelated to migraine: R11.15

## 2014-11-14 LAB — URINALYSIS, ROUTINE W REFLEX MICROSCOPIC
GLUCOSE, UA: NEGATIVE mg/dL
Hgb urine dipstick: NEGATIVE
Ketones, ur: 15 mg/dL — AB
Nitrite: NEGATIVE
PH: 7 (ref 5.0–8.0)
Protein, ur: 30 mg/dL — AB
SPECIFIC GRAVITY, URINE: 1.024 (ref 1.005–1.030)
Urobilinogen, UA: 1 mg/dL (ref 0.0–1.0)

## 2014-11-14 LAB — URINE MICROSCOPIC-ADD ON

## 2014-11-14 MED ORDER — TRAMADOL HCL 50 MG PO TABS: 50.0000 mg | ORAL_TABLET | Freq: Four times a day (QID) | ORAL | Status: DC | PRN

## 2014-11-14 MED ORDER — ONDANSETRON HCL 4 MG/2ML IJ SOLN
4.0000 mg | Freq: Once | INTRAMUSCULAR | Status: AC
  Administered 2014-11-14: 4 mg via INTRAVENOUS
  Filled 2014-11-14: qty 2

## 2014-11-14 MED ORDER — PROMETHAZINE HCL 25 MG/ML IJ SOLN
25.0000 mg | Freq: Once | INTRAMUSCULAR | Status: AC
  Administered 2014-11-14: 25 mg via INTRAVENOUS
  Filled 2014-11-14: qty 1

## 2014-11-14 MED ORDER — ONDANSETRON 4 MG PO TBDP: 4.0000 mg | ORAL_TABLET | Freq: Three times a day (TID) | ORAL | Status: AC | PRN

## 2014-11-14 MED ORDER — HYDROMORPHONE HCL 1 MG/ML IJ SOLN
1.0000 mg | Freq: Once | INTRAMUSCULAR | Status: AC
  Administered 2014-11-14: 1 mg via INTRAVENOUS
  Filled 2014-11-14: qty 1

## 2014-11-14 NOTE — Discharge Instructions (Signed)

## 2014-11-14 NOTE — ED Provider Notes (Signed)
CSN: [id number]     Arrival date & time 11/13/14  2111 History  This chart was scribed for Laura Roller, MD by Roxy Cedar, ED Scribe. This patient was seen in room A06C/A06C and the patient's care was started at 12:37 AM.   Chief Complaint  Patient presents with  . Abdominal Pain   Patient is a 43 y.o. female presenting with abdominal pain. The history is provided by the patient. No language interpreter was used.  Abdominal Pain Associated symptoms: diarrhea, nausea and vomiting   Associated symptoms: no dysuria    HPI Comments: Laura Ho is a 43 y.o. female with a history of diverticulitis and a CT scan done in August with normal results, chronic back pain, who was recently seen for diverticulitis, who presents to the Emergency Department complaining of moderate abdominal pain that worsened 2 days ago. She states her pain feels like "contractions" and states that the pain comes in waves. She reports associated multiple episodes of emesis, and diarrhea with loose stool. She was seen at [facility] 2 days ago. Xrays showed normal results. She was given pain medications. She states she had a recent colonoscopy with 2 pre-cancerous lumps identified. She was diagnosed with diverticulosis. She reports decrease in urine frequency in the past few days. She reports history of cholecystectomy, appendectomy.  Patient has history of chronic pain and nausea for the past 8 weeks since having normal CT scan. Since then she has been seen by GI specialist for colonoscopy and was diagnosed with diverticulosis. She was seen in the ER couple days ago normal xray results. Told she may have chronic cyclic vomiting syndrome.  Past Medical History  Diagnosis Date  . Chronic back pain   . Diverticulitis   . Cyclical vomiting syndrome    Past Surgical History  Procedure Laterality Date  . Abdominal hysterectomy    . Back surgery     History reviewed. No pertinent family history. History  Substance Use  Topics  . Smoking status: Current Every Day Smoker -- 1.00 packs/day    Types: Cigarettes  . Smokeless tobacco: Not on file  . Alcohol Use: No   OB History    No data available     Review of Systems  Gastrointestinal: Positive for nausea, vomiting, abdominal pain and diarrhea.  Genitourinary: Positive for frequency (decreased). Negative for dysuria.  All other systems reviewed and are negative.  Allergies  Sulfa antibiotics and Biaxin  Home Medications   Prior to Admission medications   Medication Sig Start Date End Date Taking? Authorizing Provider  promethazine (PHENERGAN) 25 MG tablet Take 25 mg by mouth every 6 (six) hours as needed for nausea or vomiting.   Yes Historical Provider, MD  Naproxen (NAPROSYN PO) Take by mouth.    Historical Provider, MD  ondansetron (ZOFRAN ODT) 4 MG disintegrating tablet Take 1 tablet (4 mg total) by mouth every 8 (eight) hours as needed for nausea. 11/14/14   Laura Roller, MD  traMADol (ULTRAM) 50 MG tablet Take 1 tablet (50 mg total) by mouth every 6 (six) hours as needed. 11/14/14   Laura Roller, MD   Triage Vitals: BP 142/91 mmHg  Pulse 90  Temp(Src) 99.1 F (37.3 C) (Oral)  Resp 22  SpO2 99%  Physical Exam  Constitutional: She appears well-developed and well-nourished. No distress.  Appeared uncomfortable upon examination.  HENT:  Head: Normocephalic and atraumatic.  Mouth/Throat: Oropharynx is clear and moist. No oropharyngeal exudate.  Eyes: Conjunctivae and EOM are  normal. Pupils are equal, round, and reactive to light. Right eye exhibits no discharge. Left eye exhibits no discharge. No scleral icterus.  Neck: Normal range of motion. Neck supple. No JVD present. No thyromegaly present.  Cardiovascular: Normal rate, regular rhythm, normal heart sounds and intact distal pulses.  Exam reveals no gallop and no friction rub.   No murmur heard. Pulmonary/Chest: Effort normal and breath sounds normal. No respiratory distress. She  has no wheezes. She has no rales.  Abdominal: Soft. Bowel sounds are normal. She exhibits no distension and no mass. There is tenderness.  Suprapubic tenderness.  Musculoskeletal: Normal range of motion. She exhibits no edema or tenderness.  Lymphadenopathy:    She has no cervical adenopathy.  Neurological: She is alert. Coordination normal.  Skin: Skin is warm and dry. No rash noted. No erythema.  Psychiatric: She has a normal mood and affect. Her behavior is normal.  Nursing note and vitals reviewed.  ED Course  Procedures (including critical care time)  DIAGNOSTIC STUDIES: Oxygen Saturation is 99% on RA, normal by my interpretation.    COORDINATION OF CARE: 12:44 AM- Discussed plans to order diagnostic lab work. Will give patient Zofran, Dilaudid, Phenergan and Sublimaze. Pt advised of plan for treatment and pt agrees.  Labs Review Labs Reviewed  COMPREHENSIVE METABOLIC PANEL - Abnormal; Notable for the following:    Potassium 3.5 (*)    Glucose, Bld 126 (*)    Anion gap 17 (*)    All other components within normal limits  LIPASE, BLOOD - Abnormal; Notable for the following:    Lipase 65 (*)    All other components within normal limits  URINALYSIS, ROUTINE W REFLEX MICROSCOPIC - Abnormal; Notable for the following:    APPearance TURBID (*)    Bilirubin Urine SMALL (*)    Ketones, ur 15 (*)    Protein, ur 30 (*)    Leukocytes, UA TRACE (*)    All other components within normal limits  URINE MICROSCOPIC-ADD ON - Abnormal; Notable for the following:    Bacteria, UA MANY (*)    All other components within normal limits  CBC WITH DIFFERENTIAL   Imaging Review No results found.   MDM   Final diagnoses:  Chronic abdominal pain  Non-intractable vomiting with nausea, vomiting of unspecified type   The patient has declared that she think she has cyclic vomiting syndrome, she has an element of chronic abdominal pain, there has been no acute findings on prior workups nor is  there any acute findings today to suggest that she has a deeper pathologic abnormality of her abdomen or pelvis. She has been given medications as below, she appears stable for discharge   Meds given in ED:  Medications  ondansetron (ZOFRAN-ODT) disintegrating tablet 8 mg (8 mg Oral Given 11/13/14 2209)  fentaNYL (SUBLIMAZE) injection 50 mcg (50 mcg Nasal Given 11/13/14 2207)  HYDROmorphone (DILAUDID) injection 1 mg (1 mg Intravenous Given 11/14/14 0109)  promethazine (PHENERGAN) injection 25 mg (25 mg Intravenous Given 11/14/14 0110)  ondansetron (ZOFRAN) injection 4 mg (4 mg Intravenous Given 11/14/14 0109)  HYDROmorphone (DILAUDID) injection 1 mg (1 mg Intravenous Given 11/14/14 0526)  ondansetron (ZOFRAN) injection 4 mg (4 mg Intravenous Given 11/14/14 0526)  promethazine (PHENERGAN) injection 25 mg (25 mg Intravenous Given 11/14/14 0606)    New Prescriptions   ONDANSETRON (ZOFRAN ODT) 4 MG DISINTEGRATING TABLET    Take 1 tablet (4 mg total) by mouth every 8 (eight) hours as needed for nausea.  TRAMADOL (ULTRAM) 50 MG TABLET    Take 1 tablet (50 mg total) by mouth every 6 (six) hours as needed.       I personally performed the services described in this documentation, which was scribed in my presence. The recorded information has been reviewed and is accurate.  Laura RollerBrian D Haruko Mersch, MD 11/14/14 [phone]

## 2014-11-14 NOTE — ED Notes (Signed)
EDP at bedside  

## 2016-03-24 ENCOUNTER — Emergency Department (HOSPITAL_COMMUNITY)
Admission: EM | Admit: 2016-03-24 | Discharge: 2016-03-24 | Disposition: A | Discharge status: Home / Self Care | Payer: Medicare Other | Attending: Emergency Medicine | Admitting: Emergency Medicine

## 2016-03-24 ENCOUNTER — Encounter (HOSPITAL_COMMUNITY): Payer: Self-pay | Admitting: *Deleted

## 2016-03-24 DIAGNOSIS — Z8719 Personal history of other diseases of the digestive system: Secondary | ICD-10-CM | POA: Diagnosis not present

## 2016-03-24 DIAGNOSIS — Z791 Long term (current) use of non-steroidal anti-inflammatories (NSAID): Secondary | ICD-10-CM | POA: Diagnosis not present

## 2016-03-24 DIAGNOSIS — G8929 Other chronic pain: Secondary | ICD-10-CM | POA: Diagnosis not present

## 2016-03-24 DIAGNOSIS — F17210 Nicotine dependence, cigarettes, uncomplicated: Secondary | ICD-10-CM | POA: Diagnosis not present

## 2016-03-24 DIAGNOSIS — Z79899 Other long term (current) drug therapy: Secondary | ICD-10-CM | POA: Insufficient documentation

## 2016-03-24 DIAGNOSIS — M546 Pain in thoracic spine: Secondary | ICD-10-CM | POA: Insufficient documentation

## 2016-03-24 DIAGNOSIS — Z7952 Long term (current) use of systemic steroids: Secondary | ICD-10-CM | POA: Insufficient documentation

## 2016-03-24 LAB — URINALYSIS, ROUTINE W REFLEX MICROSCOPIC
Bilirubin Urine: NEGATIVE
GLUCOSE, UA: NEGATIVE mg/dL
Hgb urine dipstick: NEGATIVE
Ketones, ur: NEGATIVE mg/dL
LEUKOCYTES UA: NEGATIVE
NITRITE: NEGATIVE
PH: 6 (ref 5.0–8.0)
PROTEIN: NEGATIVE mg/dL
SPECIFIC GRAVITY, URINE: 1.022 (ref 1.005–1.030)

## 2016-03-24 MED ORDER — OXYCODONE-ACETAMINOPHEN 5-325 MG PO TABS
2.0000 | ORAL_TABLET | Freq: Once | ORAL | Status: AC
  Administered 2016-03-24: 2 via ORAL
  Filled 2016-03-24: qty 2

## 2016-03-24 MED ORDER — DEXAMETHASONE SODIUM PHOSPHATE 10 MG/ML IJ SOLN
10.0000 mg | Freq: Once | INTRAMUSCULAR | Status: AC
  Administered 2016-03-24: 10 mg via INTRAMUSCULAR
  Filled 2016-03-24: qty 1

## 2016-03-24 MED ORDER — GABAPENTIN 300 MG PO CAPS
300.0000 mg | ORAL_CAPSULE | Freq: Once | ORAL | Status: AC
  Administered 2016-03-24: 300 mg via ORAL
  Filled 2016-03-24: qty 1

## 2016-03-24 MED ORDER — LIDOCAINE 5 % EX PTCH: 1.0000 | MEDICATED_PATCH | CUTANEOUS | Status: DC

## 2016-03-24 MED ORDER — KETOROLAC TROMETHAMINE 60 MG/2ML IM SOLN
60.0000 mg | Freq: Once | INTRAMUSCULAR | Status: AC
  Administered 2016-03-24: 60 mg via INTRAMUSCULAR
  Filled 2016-03-24: qty 2

## 2016-03-24 MED ORDER — METHOCARBAMOL 500 MG PO TABS
1000.0000 mg | ORAL_TABLET | Freq: Once | ORAL | Status: AC
  Administered 2016-03-24: 1000 mg via ORAL
  Filled 2016-03-24: qty 2

## 2016-03-24 MED ORDER — OXYCODONE-ACETAMINOPHEN 5-325 MG PO TABS: 1.0000 | ORAL_TABLET | Freq: Once | ORAL | Status: DC

## 2016-03-24 MED ORDER — PREDNISONE 10 MG (21) PO TBPK: 10.0000 mg | ORAL_TABLET | Freq: Every day | ORAL | Status: DC

## 2016-03-24 MED ORDER — LIDOCAINE 5 % EX PTCH
1.0000 | MEDICATED_PATCH | CUTANEOUS | Status: DC
  Administered 2016-03-24: 1 via TRANSDERMAL
  Filled 2016-03-24: qty 1

## 2016-03-24 NOTE — ED Provider Notes (Signed)
CSN: [id number]     Arrival date & time 03/24/16  1904 History   First MD Initiated Contact with Patient 03/24/16 1922     Chief Complaint  Patient presents with  . Back Pain     (Consider location/radiation/quality/duration/timing/severity/associated sxs/prior Treatment) HPI   Laura Ho is a 45 y.o. female, with a history of chronic back pain, presenting to the ED with acute on chronic right sided mid back pain that began 3 days ago. Patient states that she has felt pain before in this area, but states that the pain is much worse this time. Pain increases with bending, twisting, or movement of any kind. Pain is reproducible with palpation and movement. Patient states, "I think it might be my kidneys." Patient has taken doses of her home Percocet and Flexeril prior to arrival with little improvement. Patient denies falls or trauma. Patient further denies neuro deficits, urinary complaints, change in bowel or bladder function, fever/chills, night sweats, weight loss, IV drug use, or any other complaints or pertinent history.     Past Medical History  Diagnosis Date  . Chronic back pain   . Diverticulitis   . Cyclical vomiting syndrome    Past Surgical History  Procedure Laterality Date  . Abdominal hysterectomy    . Back surgery     No family history on file. Social History  Substance Use Topics  . Smoking status: Current Every Day Smoker -- 1.00 packs/day    Types: Cigarettes  . Smokeless tobacco: Never Used  . Alcohol Use: No   OB History    No data available     Review of Systems  Constitutional: Negative for fever and chills.  Genitourinary: Negative for dysuria, hematuria, flank pain and vaginal discharge.  Musculoskeletal: Positive for back pain. Negative for neck pain.  Neurological: Negative for weakness and numbness.  All other systems reviewed and are negative.     Allergies  Sulfa antibiotics and Biaxin  Home Medications   Prior to Admission  medications   Medication Sig Start Date End Date Taking? Authorizing Provider  oxyCODONE-acetaminophen (PERCOCET) 10-325 MG tablet Take 1 tablet by mouth every 4 (four) hours as needed for pain.   Yes Historical Provider, MD  lidocaine (LIDODERM) 5 % Place 1 patch onto the skin daily. Remove & Discard patch within 12 hours or as directed by MD 03/24/16   Anselm Pancoast, PA-C  Naproxen (NAPROSYN PO) Take by mouth.    Historical Provider, MD  ondansetron (ZOFRAN ODT) 4 MG disintegrating tablet Take 1 tablet (4 mg total) by mouth every 8 (eight) hours as needed for nausea. 11/14/14   Eber Hong, MD  predniSONE (STERAPRED UNI-PAK 21 TAB) 10 MG (21) TBPK tablet Take 1 tablet (10 mg total) by mouth daily. Take 6 tabs by mouth daily  for 2 days, then 5 tabs for 2 days, then 4 tabs for 2 days, then 3 tabs for 2 days, 2 tabs for 2 days, then 1 tab by mouth daily for 2 days 03/24/16   [name], PA-C  promethazine (PHENERGAN) 25 MG tablet Take 25 mg by mouth every 6 (six) hours as needed for nausea or vomiting.    Historical Provider, MD  traMADol (ULTRAM) 50 MG tablet Take 1 tablet (50 mg total) by mouth every 6 (six) hours as needed. 11/14/14   Eber Hong, MD   BP 92/75 mmHg  Pulse 92  Temp(Src) 98.4 F (36.9 C) (Oral)  Resp 16  Ht  (1.676 m)  Wt 64.864 kg  BMI 23.09 kg/m2  SpO2 99% Physical Exam  Constitutional: She is oriented to person, place, and time. She appears well-developed and well-nourished. No distress.  HENT:  Head: Normocephalic and atraumatic.  Eyes: Conjunctivae are normal. Pupils are equal, round, and reactive to light.  Neck: Neck supple.  Cardiovascular: Normal rate, regular rhythm, normal heart sounds and intact distal pulses.   Pulmonary/Chest: Effort normal and breath sounds normal. No respiratory distress.  Abdominal: Soft. There is no tenderness. There is no guarding.  Unable to determine if the patient has CVA tenderness due to the location of her chief complaint  pain.  Musculoskeletal: She exhibits no edema or tenderness.  Tenderness to the right thoracic musculature. Pain is reproducible on palpation and with movement. Full ROM in all extremities and spine. No paraspinal tenderness.   Lymphadenopathy:    She has no cervical adenopathy.  Neurological: She is alert and oriented to person, place, and time. She has normal reflexes.  No sensory deficits. Strength 5/5 in all extremities. No gait disturbance. Coordination intact.   Skin: Skin is warm and dry. She is not diaphoretic.  Psychiatric: She has a normal mood and affect. Her behavior is normal.  Nursing note and vitals reviewed.   ED Course  Procedures (including critical care time) Labs Review Labs Reviewed  URINALYSIS, ROUTINE W REFLEX MICROSCOPIC (NOT AT [facility])    I have personally reviewed and evaluated these lab results as part of my medical decision-making.   EKG Interpretation None      MDM   Final diagnoses:  Right-sided thoracic back pain    [name] Overall presents with right mid back pain for the last 3 days.  This patient's pain is acute on chronic. It is most consistent with musculoskeletal pain. Patient has no neuro or functional deficits. No red flag symptoms. No symptoms to suggest cauda equina, abscess, or neoplasm. UA placed due to the location of the patient's pain, which shows no abnormalities. Patient's pain improved somewhat with management here in the ED. Pt offered pain management referral, but declined stating that she will just go see her PCP. Further home care and return precautions discussed. Patient voiced understanding of these instructions and is comfortable with discharge.  Filed Vitals:   03/24/16 1911 03/24/16 1920 03/24/16 2233 03/24/16 2237  BP: 137/94   92/75  Pulse: 104   92  Temp: 97.9 F (36.6 C)  98.4 F (36.9 C) 98.4 F (36.9 C)  TempSrc: Oral  Oral Oral  Resp: 20   16  Height: 5\' 6"  (1.676 m) 5\' 6"  (1.676 m)    Weight: 64.921 kg 64.864  kg    SpO2: 99%   99%       Anselm PancoastShawn C Peniel Biel, PA-C 03/25/16 1256  Gwyneth SproutWhitney Plunkett, MD 03/25/16 1521

## 2016-03-24 NOTE — Discharge Instructions (Signed)
You have been seen today for back pain. Your lab tests showed no abnormalities. It is likely that the source of your pain is muscular in nature. Follow up with PCP as soon as possible for reevaluation and chronic management. Return to ED should symptoms worsen.

## 2016-03-24 NOTE — ED Notes (Signed)
Patient presents with c/o mid back pain that has been going on for 3 days.  States it almost feels like a pulled muscle but usually doesn't last this long.  Denies urinary symptoms

## 2016-03-24 NOTE — ED Notes (Signed)
Pt left at this time with all belongings.  

## 2016-03-24 NOTE — ED Notes (Signed)
Prompted patient to provide urine specimen.

## 2016-04-15 IMAGING — CT CT ABD-PELV W/ CM
2 of 5 series · 16 of 46 positions shown, 18 images · IV contrast (APPLIED)
Comparison: 06/04/2014

CLINICAL DATA: Left lower quadrant pain. Colonoscopy yesterday with
polyp removal.

EXAM:
CT ABDOMEN AND PELVIS WITH CONTRAST
TECHNIQUE: Multidetector CT imaging of the abdomen and pelvis was performed
using the standard protocol following bolus administration of
intravenous contrast.
CONTRAST:  50mL OMNIPAQUE IOHEXOL 300 MG/ML SOLN, 100mL OMNIPAQUE
IOHEXOL 300 MG/ML SOLN

[Series 2: abd/pelvis 5.0 b31f · axial · 0.76mm/px · z∈[+868,+1268]mm · 13 of 92 slices shown, 15 images]
[im 6/92  soft-tissue]
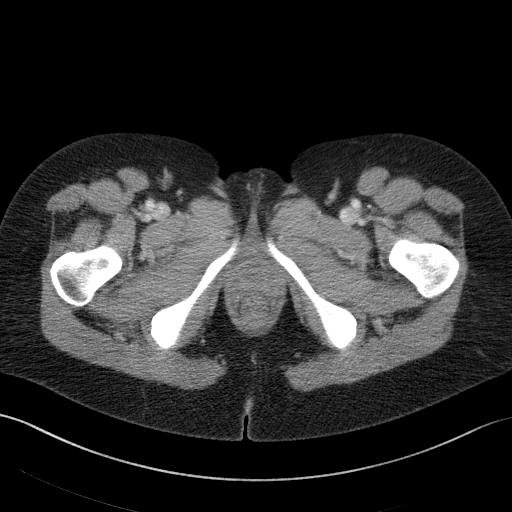
[im 6/92  bone]
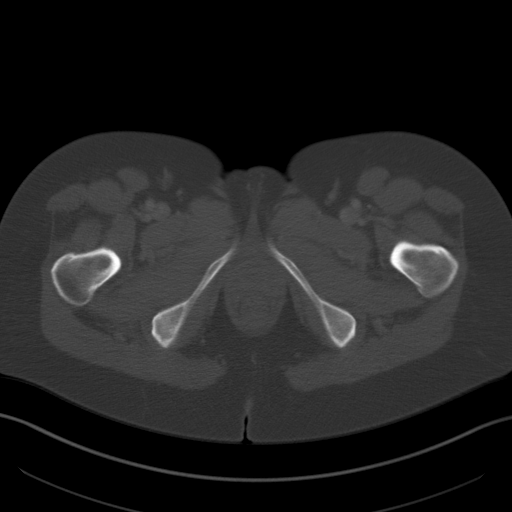
[im 11/92  soft-tissue]
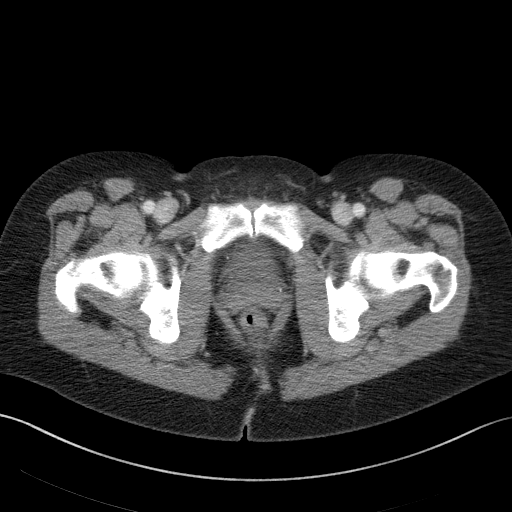
[im 22/92  soft-tissue]
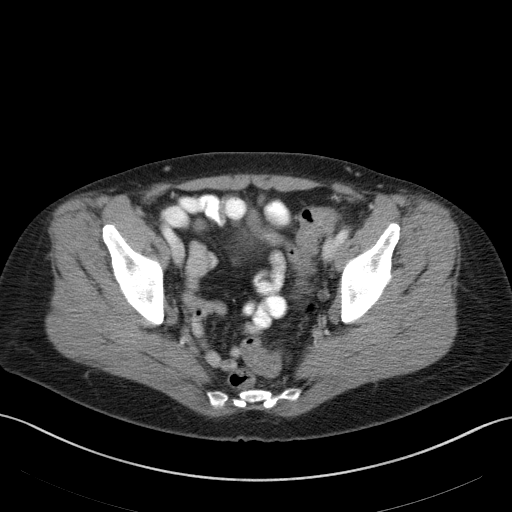
[im 27/92  soft-tissue]
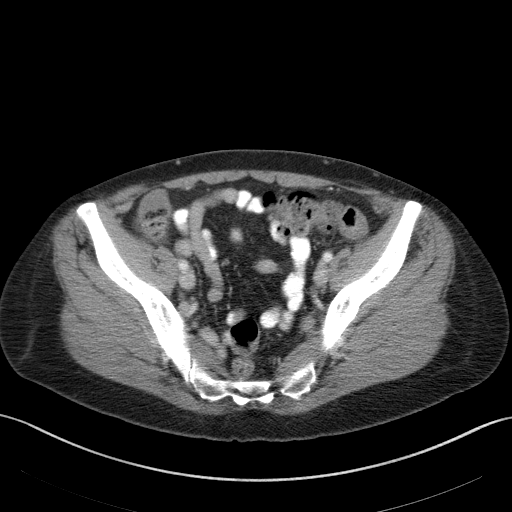
[im 33/92  soft-tissue]
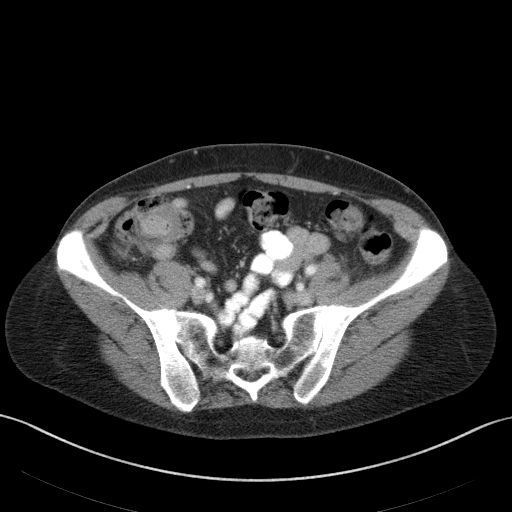
[im 38/92  soft-tissue]
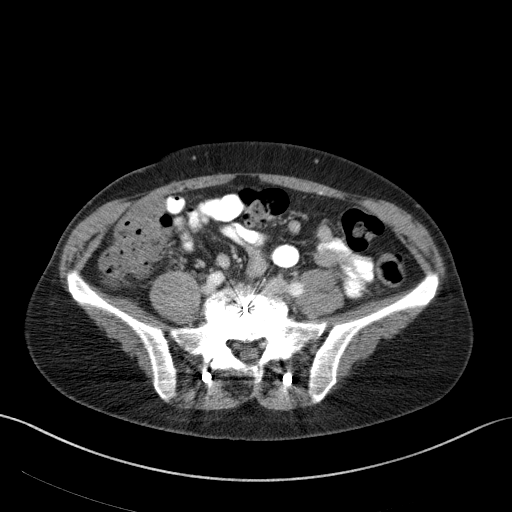
[im 49/92  soft-tissue]
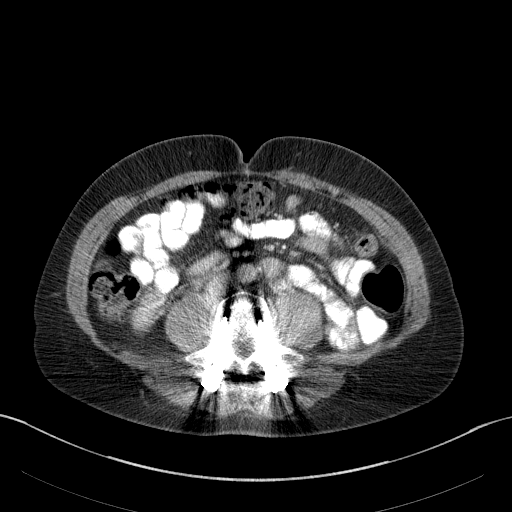
[im 54/92  soft-tissue]
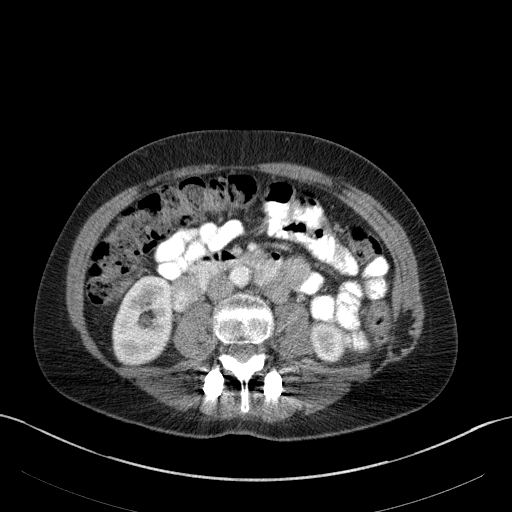
[im 59/92  soft-tissue]
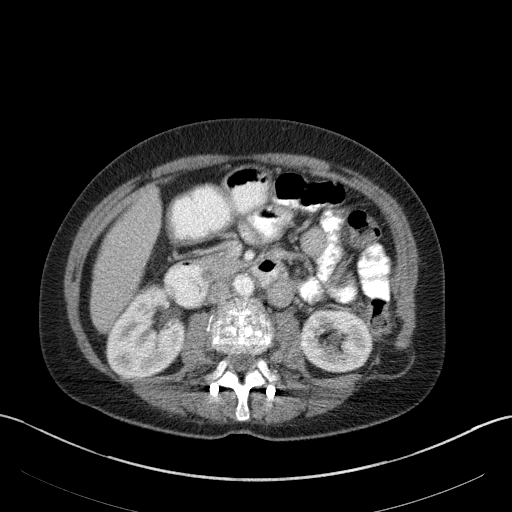
[im 59/92  bone]
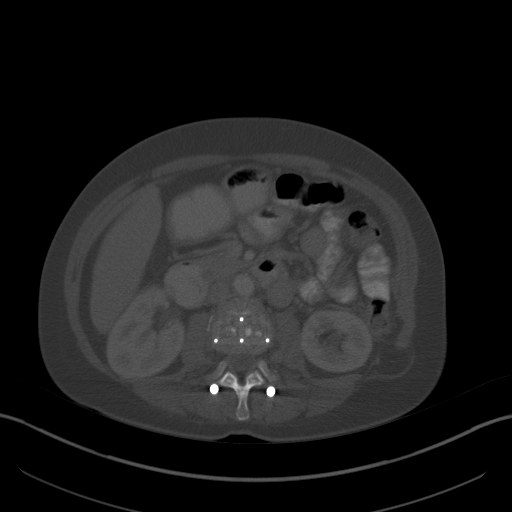
[im 65/92  soft-tissue]
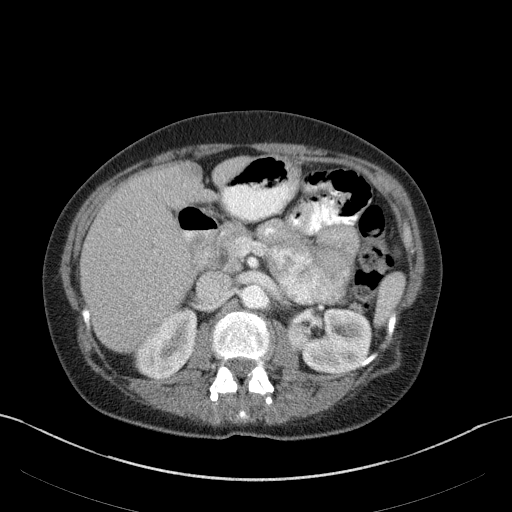
[im 70/92  soft-tissue]
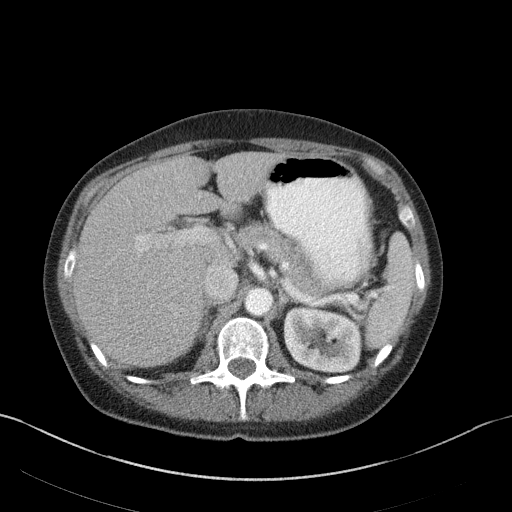
[im 81/92  soft-tissue]
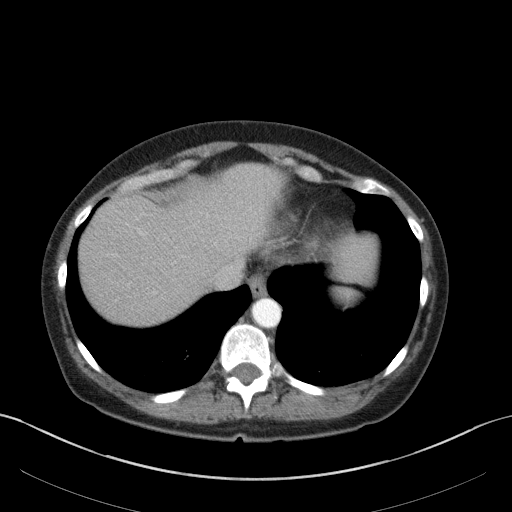
[im 86/92  soft-tissue]
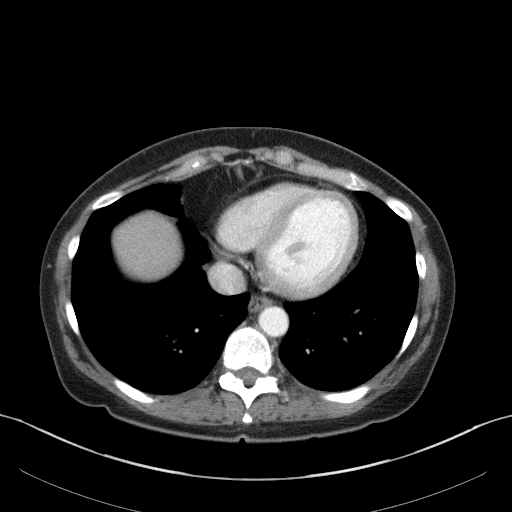

[Series 5: abd/pelvis 3.0 coronal · coronal · 0.72mm/px · 3 of 76 slices shown]
[im 26/76  soft-tissue]
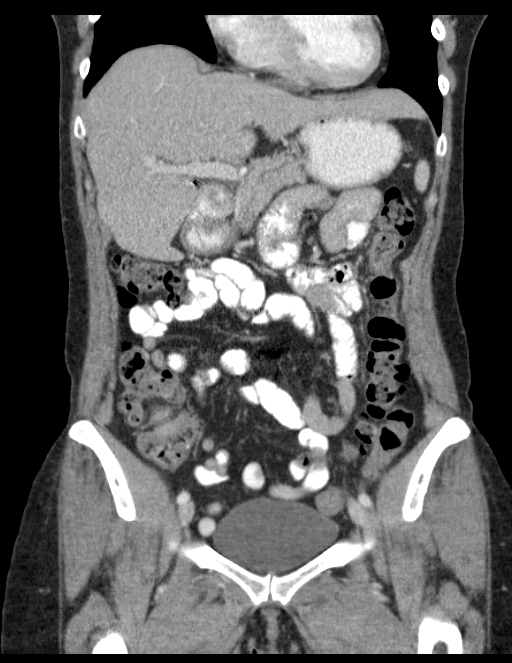
[im 34/76  soft-tissue]
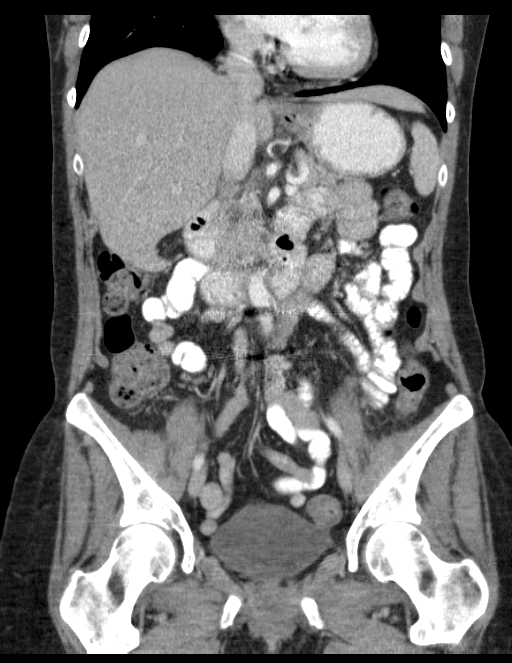
[im 42/76  soft-tissue]
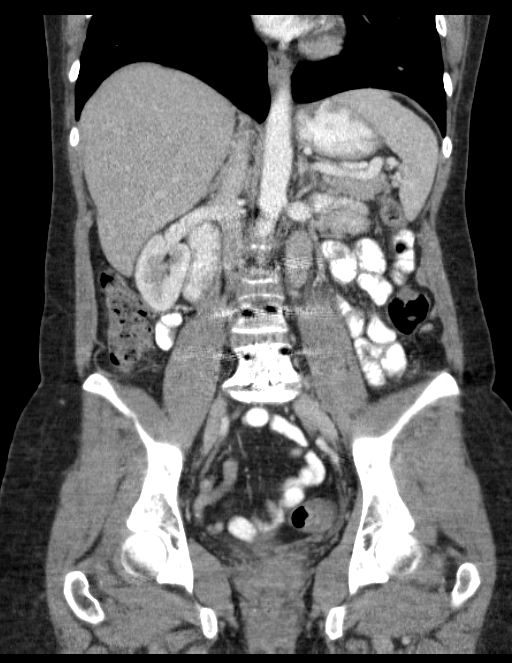

[16 of 46 positions shown; findings below may reference images not displayed]

FINDINGS: BODY WALL: Partially visualized breast implant on the left.

There is a high lumbar hernia on the left containing fat. No change
is explain current pain.

LOWER CHEST: Mild atelectasis at the bases.

ABDOMEN/PELVIS:

Liver: No focal abnormality.

Biliary: Cholecystectomy.

Pancreas: Unremarkable.

Spleen: Unremarkable.

Adrenals: Unremarkable.

Kidneys and ureters: No hydronephrosis or stone.

Bladder: Unremarkable.

Reproductive: Hysterectomy.  No adnexal mass.

Bowel: Distal colonic diverticulosis. Previously noted
diverticulitis has resolved. No pneumoperitoneum or new wall
thickening (mild sigmoid colonic wall thickening is likely from
underlying diverticulosis) to suggest injury from recent
colonoscopy. Appendectomy.

Retroperitoneum: No mass or adenopathy.

Peritoneum: No ascites or pneumoperitoneum.

Vascular: No acute abnormality.

OSSEOUS: L2 through S1 posterior rod and pedicle screw fixation with
interbody fusions. There is questionable bony growth across the
discectomies. No hardware complication or osseous fracture.
IMPRESSION: No acute intra-abdominal disease.

## 2022-08-31 ENCOUNTER — Other Ambulatory Visit: Payer: Self-pay | Admitting: Neurosurgery

## 2022-09-04 NOTE — Pre-Procedure Instructions (Signed)
Surgical Instructions    Your procedure is scheduled on September 10, 2022 .  Report to [facility] Main Entrance "A" at 8:00 A.M., then check in with the Admitting office.  Call this number if you have problems the morning of surgery:  [phone]   If you have any questions prior to your surgery date call [phone]: Open Monday-Friday 8am-4pm    Remember:  Do not eat after midnight the night before your surgery  You may drink clear liquids until 7:00 AM the morning of your surgery.   Clear liquids allowed are: Water, Non-Citrus Juices (without pulp), Carbonated Beverages, Clear Tea, Black Coffee Only (NO MILK, CREAM OR POWDERED CREAMER of any kind), and Gatorade.    Take these medicines the morning of surgery with A SIP OF WATER:  DULoxetine (CYMBALTA)   morphine (MS CONTIN)  morphine (MSIR)  topiramate (TOPAMAX)   Take these medicines the morning of surgery with a sip of water AS NEEDED:  acetaminophen (TYLENOL)  hydrOXYzine (VISTARIL)   naloxone (NARCAN)  ondansetron (ZOFRAN ODT)   tiZANidine (ZANAFLEX)     As of today, STOP taking any Aspirin (unless otherwise instructed by your surgeon) Aleve, Naproxen, Ibuprofen, Motrin, Advil, Goody's, BC's, all herbal medications, fish oil, and all vitamins.                     Do NOT Smoke (Tobacco/Vaping) for 24 hours prior to your procedure.  If you use a CPAP at night, you may bring your mask/headgear for your overnight stay.   Contacts, glasses, piercing's, hearing aid's, dentures or partials may not be worn into surgery, please bring cases for these belongings.    For patients admitted to the hospital, discharge time will be determined by your treatment team.   Patients discharged the day of surgery will not be allowed to drive home, and someone needs to stay with them for 24 hours.  SURGICAL WAITING ROOM VISITATION Patients having surgery or a procedure may have no more than 2 support people in the waiting area - these  visitors may rotate.   Children under the age of 75 must have an adult with them who is not the patient. If the patient needs to stay at the hospital during part of their recovery, the visitor guidelines for inpatient rooms apply. Pre-op nurse will coordinate an appropriate time for 1 support person to accompany patient in pre-op.  This support person may not rotate.   Please refer to the [facility] website for the visitor guidelines for Inpatients (after your surgery is over and you are in a regular room).    Special instructions:   [facility]- Preparing For Surgery  Before surgery, you can play an important role. Because skin is not sterile, your skin needs to be as free of germs as possible. You can reduce the number of germs on your skin by washing with CHG (chlorahexidine gluconate) Soap before surgery.  CHG is an antiseptic cleaner which kills germs and bonds with the skin to continue killing germs even after washing.    Oral Hygiene is also important to reduce your risk of infection.  Remember - BRUSH YOUR TEETH THE MORNING OF SURGERY WITH YOUR REGULAR TOOTHPASTE  Please do not use if you have an allergy to CHG or antibacterial soaps. If your skin becomes reddened/irritated stop using the CHG.  Do not shave (including legs and underarms) for at least 48 hours prior to first CHG shower. It is OK to shave your  face.  Please follow these instructions carefully.   Shower the NIGHT BEFORE SURGERY and the MORNING OF SURGERY  If you chose to wash your hair, wash your hair first as usual with your normal shampoo.  After you shampoo, rinse your hair and body thoroughly to remove the shampoo.  Use CHG Soap as you would any other liquid soap. You can apply CHG directly to the skin and wash gently with a scrungie or a clean washcloth.   Apply the CHG Soap to your body ONLY FROM THE NECK DOWN.  Do not use on open wounds or open sores. Avoid contact with your eyes, ears, mouth and genitals  (private parts). Wash Face and genitals (private parts)  with your normal soap.   Wash thoroughly, paying special attention to the area where your surgery will be performed.  Thoroughly rinse your body with warm water from the neck down.  DO NOT shower/wash with your normal soap after using and rinsing off the CHG Soap.  Pat yourself dry with a CLEAN TOWEL.  Wear CLEAN PAJAMAS to bed the night before surgery  Place CLEAN SHEETS on your bed the night before your surgery  DO NOT SLEEP WITH PETS.   Day of Surgery: Take a shower with CHG soap. Do not wear jewelry or makeup Do not wear lotions, powders, perfumes/colognes, or deodorant. Do not shave 48 hours prior to surgery.  Men may shave face and neck. Do not bring valuables to the hospital.  [facility] is not responsible for any belongings or valuables. Do not wear nail polish, gel polish, artificial nails, or any other type of covering on natural nails (fingers and toes) If you have artificial nails or gel coating that need to be removed by a nail salon, please have this removed prior to surgery. Artificial nails or gel coating may interfere with anesthesia's ability to adequately monitor your vital signs.  Wear Clean/Comfortable clothing the morning of surgery Remember to brush your teeth WITH YOUR REGULAR TOOTHPASTE.   Please read over the following fact sheets that you were given.    If you received a COVID test during your pre-op visit  it is requested that you wear a mask when out in public, stay away from anyone that may not be feeling well and notify your surgeon if you develop symptoms. If you have been in contact with anyone that has tested positive in the last 10 days please notify you surgeon.

## 2022-09-05 ENCOUNTER — Other Ambulatory Visit: Payer: Self-pay

## 2022-09-05 ENCOUNTER — Encounter (HOSPITAL_COMMUNITY): Payer: Self-pay

## 2022-09-05 ENCOUNTER — Encounter (HOSPITAL_COMMUNITY)
Admission: RE | Admit: 2022-09-05 | Discharge: 2022-09-05 | Disposition: A | Discharge status: Home / Self Care | Payer: Medicare Other | Source: Ambulatory Visit | Attending: Neurosurgery | Admitting: Neurosurgery

## 2022-09-05 VITALS — BP 99/72 | HR 90 | Temp 97.7°F | Resp 17 | Ht 66.0 in | Wt 133.7 lb

## 2022-09-05 DIAGNOSIS — Z01818 Encounter for other preprocedural examination: Secondary | ICD-10-CM | POA: Insufficient documentation

## 2022-09-05 DIAGNOSIS — R9431 Abnormal electrocardiogram [ECG] [EKG]: Secondary | ICD-10-CM | POA: Insufficient documentation

## 2022-09-05 HISTORY — DX: Family history of other specified conditions: Z84.89

## 2022-09-05 HISTORY — DX: Unspecified hemorrhoids: K64.9

## 2022-09-05 LAB — BASIC METABOLIC PANEL
Anion gap: 6 (ref 5–15)
BUN: 13 mg/dL (ref 6–20)
CO2: 23 mmol/L (ref 22–32)
Calcium: 8.9 mg/dL (ref 8.9–10.3)
Chloride: 109 mmol/L (ref 98–111)
Creatinine, Ser: 0.71 mg/dL (ref 0.44–1.00)
GFR, Estimated: >60 mL/min (ref >60)
Glucose, Bld: 87 mg/dL (ref 70–99)
Potassium: 4.3 mmol/L (ref 3.5–5.1)
Sodium: 138 mmol/L (ref 135–145)

## 2022-09-05 LAB — SURGICAL PCR SCREEN
MRSA, PCR: NEGATIVE
Staphylococcus aureus: NEGATIVE

## 2022-09-05 NOTE — Progress Notes (Signed)
PCP - Milas Gain with [facility] Cardiologist - Denies  PPM/ICD - Denies  Chest x-ray - NI EKG - 09/05/22 Stress Test - Years ago no cardiology f/u ECHO - denies Cardiac Cath - Denies  Sleep Study - Denies  DM =Denies   Anesthesia review: No  Patient denies shortness of breath, fever, cough and chest pain at PAT appointment   All instructions explained to the patient, with a verbal understanding of the material. Patient agrees to go over the instructions while at home for a better understanding.  The opportunity to ask questions was provided.

## 2022-09-05 NOTE — Progress Notes (Signed)
1110  Called to find out if patient was still coming to her 1100 appt.  No answer but I left a message with our phone number, [phone], to call us back and let us know.

## 2022-09-10 ENCOUNTER — Encounter (HOSPITAL_COMMUNITY)
Admission: RE | Disposition: A | Discharge status: Home / Self Care | Payer: Self-pay | Source: Home / Self Care | Attending: Neurosurgery

## 2022-09-10 ENCOUNTER — Inpatient Hospital Stay (HOSPITAL_COMMUNITY): Payer: Medicare Other

## 2022-09-10 ENCOUNTER — Other Ambulatory Visit: Payer: Self-pay

## 2022-09-10 ENCOUNTER — Inpatient Hospital Stay (HOSPITAL_COMMUNITY)
Admission: RE | Admit: 2022-09-10 | Discharge: 2022-09-12 | DRG: 454 | Disposition: A | Discharge status: Home / Self Care | Payer: Medicare Other | Attending: Neurosurgery | Admitting: Neurosurgery

## 2022-09-10 ENCOUNTER — Encounter (HOSPITAL_COMMUNITY): Payer: Self-pay | Admitting: Neurosurgery

## 2022-09-10 ENCOUNTER — Inpatient Hospital Stay (HOSPITAL_COMMUNITY): Payer: Medicare Other | Admitting: Anesthesiology

## 2022-09-10 ENCOUNTER — Inpatient Hospital Stay (HOSPITAL_COMMUNITY): Payer: Medicare Other | Admitting: Vascular Surgery

## 2022-09-10 DIAGNOSIS — T84038A Mechanical loosening of other internal prosthetic joint, initial encounter: Secondary | ICD-10-CM | POA: Diagnosis present

## 2022-09-10 DIAGNOSIS — G8929 Other chronic pain: Secondary | ICD-10-CM | POA: Diagnosis present

## 2022-09-10 DIAGNOSIS — M47896 Other spondylosis, lumbar region: Secondary | ICD-10-CM | POA: Diagnosis not present

## 2022-09-10 DIAGNOSIS — Z9682 Presence of neurostimulator: Secondary | ICD-10-CM | POA: Diagnosis not present

## 2022-09-10 DIAGNOSIS — M532X6 Spinal instabilities, lumbar region: Secondary | ICD-10-CM | POA: Diagnosis present

## 2022-09-10 DIAGNOSIS — M5136 Other intervertebral disc degeneration, lumbar region: Secondary | ICD-10-CM | POA: Diagnosis present

## 2022-09-10 DIAGNOSIS — Z90710 Acquired absence of both cervix and uterus: Secondary | ICD-10-CM | POA: Diagnosis not present

## 2022-09-10 DIAGNOSIS — Z881 Allergy status to other antibiotic agents status: Secondary | ICD-10-CM

## 2022-09-10 DIAGNOSIS — M960 Pseudarthrosis after fusion or arthrodesis: Secondary | ICD-10-CM | POA: Diagnosis present

## 2022-09-10 DIAGNOSIS — Z79891 Long term (current) use of opiate analgesic: Secondary | ICD-10-CM

## 2022-09-10 DIAGNOSIS — M549 Dorsalgia, unspecified: Secondary | ICD-10-CM | POA: Diagnosis present

## 2022-09-10 DIAGNOSIS — Z87891 Personal history of nicotine dependence: Secondary | ICD-10-CM | POA: Diagnosis not present

## 2022-09-10 DIAGNOSIS — Z882 Allergy status to sulfonamides status: Secondary | ICD-10-CM | POA: Diagnosis not present

## 2022-09-10 DIAGNOSIS — M48061 Spinal stenosis, lumbar region without neurogenic claudication: Secondary | ICD-10-CM | POA: Diagnosis present

## 2022-09-10 DIAGNOSIS — Z79899 Other long term (current) drug therapy: Secondary | ICD-10-CM | POA: Diagnosis not present

## 2022-09-10 HISTORY — PX: LAMINECTOMY WITH POSTERIOR LATERAL ARTHRODESIS LEVEL 1: SHX6335

## 2022-09-10 HISTORY — PX: ANTERIOR LAT LUMBAR FUSION: SHX1168

## 2022-09-10 HISTORY — PX: LUMBAR SPINAL CORD SIMULATOR LEAD REMOVAL: SHX6819

## 2022-09-10 LAB — POCT I-STAT 7, (LYTES, BLD GAS, ICA,H+H)
Acid-base deficit: 3 mmol/L — ABNORMAL HIGH (ref 0.0–2.0)
Acid-base deficit: 2 mmol/L (ref 0.0–2.0)
Bicarbonate: 22.5 mmol/L (ref 20.0–28.0)
Bicarbonate: 23.2 mmol/L (ref 20.0–28.0)
Calcium, Ion: 1.25 mmol/L (ref 1.15–1.40)
Calcium, Ion: 1.23 mmol/L (ref 1.15–1.40)
HCT: 32 % — ABNORMAL LOW (ref 36.0–46.0)
HCT: 36 % (ref 36.0–46.0)
Hemoglobin: 10.9 g/dL — ABNORMAL LOW (ref 12.0–15.0)
Hemoglobin: 12.2 g/dL (ref 12.0–15.0)
O2 Saturation: 100 %
O2 Saturation: 100 %
Potassium: 3.3 mmol/L — ABNORMAL LOW (ref 3.5–5.1)
Potassium: 3.3 mmol/L — ABNORMAL LOW (ref 3.5–5.1)
Sodium: 138 mmol/L (ref 135–145)
Sodium: 138 mmol/L (ref 135–145)
TCO2: 24 mmol/L (ref 22–32)
TCO2: 24 mmol/L (ref 22–32)
pCO2 arterial: 41.3 mmHg (ref 32–48)
pCO2 arterial: 40.6 mmHg (ref 32–48)
pH, Arterial: 7.343 — ABNORMAL LOW (ref 7.35–7.45)
pH, Arterial: 7.365 (ref 7.35–7.45)
pO2, Arterial: 191 mmHg — ABNORMAL HIGH (ref 83–108)
pO2, Arterial: 195 mmHg — ABNORMAL HIGH (ref 83–108)

## 2022-09-10 LAB — CBC
HCT: 37.4 % (ref 36.0–46.0)
Hemoglobin: 11.6 g/dL — ABNORMAL LOW (ref 12.0–15.0)
MCH: 32.2 pg (ref 26.0–34.0)
MCHC: 31 g/dL (ref 30.0–36.0)
MCV: 103.9 fL — ABNORMAL HIGH (ref 80.0–100.0)
Platelets: 276 10*3/uL (ref 150–400)
RBC: 3.6 MIL/uL — ABNORMAL LOW (ref 3.87–5.11)
RDW: 14.2 % (ref 11.5–15.5)
WBC: 6.1 10*3/uL (ref 4.0–10.5)
nRBC: 0 % (ref 0.0–0.2)

## 2022-09-10 LAB — TYPE AND SCREEN
ABO/RH(D): A POS
Antibody Screen: NEGATIVE

## 2022-09-10 SURGERY — ANTERIOR LATERAL LUMBAR FUSION 1 LEVEL
Anesthesia: General | Site: Spine Lumbar | Laterality: Right

## 2022-09-10 MED ORDER — EPHEDRINE 5 MG/ML INJ
INTRAVENOUS | Status: AC
  Filled 2022-09-10: qty 5

## 2022-09-10 MED ORDER — PHENYLEPHRINE 80 MCG/ML (10ML) SYRINGE FOR IV PUSH (FOR BLOOD PRESSURE SUPPORT)
PREFILLED_SYRINGE | INTRAVENOUS | Status: DC | PRN
  Administered 2022-09-10: 40 ug via INTRAVENOUS

## 2022-09-10 MED ORDER — ONDANSETRON HCL 4 MG/2ML IJ SOLN
INTRAMUSCULAR | Status: AC
  Filled 2022-09-10: qty 2

## 2022-09-10 MED ORDER — LORATADINE 10 MG PO TABS
10.0000 mg | ORAL_TABLET | Freq: Every day | ORAL | Status: DC
  Administered 2022-09-10 – 2022-09-11 (×2): 10 mg via ORAL
  Filled 2022-09-10 (×2): qty 1

## 2022-09-10 MED ORDER — NALOXONE HCL 0.4 MG/ML IJ SOLN: 0.4000 mg | INTRAMUSCULAR | Status: DC | PRN

## 2022-09-10 MED ORDER — CYCLOBENZAPRINE HCL 10 MG PO TABS
10.0000 mg | ORAL_TABLET | Freq: Three times a day (TID) | ORAL | Status: DC | PRN
  Administered 2022-09-10 – 2022-09-12 (×3): 10 mg via ORAL
  Filled 2022-09-10 (×3): qty 1

## 2022-09-10 MED ORDER — LACTATED RINGERS IV SOLN: INTRAVENOUS | Status: DC

## 2022-09-10 MED ORDER — DULOXETINE HCL 30 MG PO CPEP
60.0000 mg | ORAL_CAPSULE | Freq: Two times a day (BID) | ORAL | Status: DC
  Administered 2022-09-10 – 2022-09-12 (×4): 60 mg via ORAL
  Filled 2022-09-10 (×4): qty 2

## 2022-09-10 MED ORDER — HYDROXYZINE HCL 25 MG PO TABS
25.0000 mg | ORAL_TABLET | Freq: Four times a day (QID) | ORAL | Status: DC | PRN
  Administered 2022-09-11: 25 mg via ORAL
  Filled 2022-09-10: qty 1

## 2022-09-10 MED ORDER — CEFAZOLIN SODIUM-DEXTROSE 2-4 GM/100ML-% IV SOLN
INTRAVENOUS | Status: AC
  Filled 2022-09-10: qty 100

## 2022-09-10 MED ORDER — CHLORHEXIDINE GLUCONATE 0.12 % MT SOLN
15.0000 mL | Freq: Once | OROMUCOSAL | Status: AC
  Administered 2022-09-10: 15 mL via OROMUCOSAL
  Filled 2022-09-10: qty 15

## 2022-09-10 MED ORDER — FENTANYL CITRATE (PF) 100 MCG/2ML IJ SOLN
25.0000 ug | INTRAMUSCULAR | Status: DC | PRN
  Administered 2022-09-10 (×3): 25 ug via INTRAVENOUS

## 2022-09-10 MED ORDER — SUFENTANIL CITRATE 50 MCG/ML IV SOLN
INTRAVENOUS | Status: AC
  Filled 2022-09-10: qty 1

## 2022-09-10 MED ORDER — PANTOPRAZOLE SODIUM 40 MG IV SOLR
40.0000 mg | Freq: Every day | INTRAVENOUS | Status: DC
  Administered 2022-09-10: 40 mg via INTRAVENOUS
  Filled 2022-09-10: qty 10

## 2022-09-10 MED ORDER — LACTATED RINGERS IV SOLN: INTRAVENOUS | Status: DC | PRN

## 2022-09-10 MED ORDER — ONDANSETRON HCL 4 MG PO TABS
4.0000 mg | ORAL_TABLET | Freq: Four times a day (QID) | ORAL | Status: DC | PRN
  Administered 2022-09-11: 4 mg via ORAL
  Filled 2022-09-10: qty 1

## 2022-09-10 MED ORDER — LIDOCAINE-EPINEPHRINE 1 %-1:100000 IJ SOLN
INTRAMUSCULAR | Status: DC | PRN
  Administered 2022-09-10: 9 mL
  Administered 2022-09-10: 10 mL

## 2022-09-10 MED ORDER — ONDANSETRON 4 MG PO TBDP: 4.0000 mg | ORAL_TABLET | Freq: Three times a day (TID) | ORAL | Status: DC | PRN

## 2022-09-10 MED ORDER — TIZANIDINE HCL 4 MG PO TABS
4.0000 mg | ORAL_TABLET | Freq: Three times a day (TID) | ORAL | Status: DC | PRN
  Administered 2022-09-11 (×3): 4 mg via ORAL
  Filled 2022-09-10 (×3): qty 1

## 2022-09-10 MED ORDER — MIDAZOLAM HCL 2 MG/2ML IJ SOLN
INTRAMUSCULAR | Status: DC | PRN
  Administered 2022-09-10: 2 mg via INTRAVENOUS

## 2022-09-10 MED ORDER — CEFAZOLIN SODIUM-DEXTROSE 2-4 GM/100ML-% IV SOLN
2.0000 g | Freq: Three times a day (TID) | INTRAVENOUS | Status: AC
  Administered 2022-09-10 – 2022-09-11 (×2): 2 g via INTRAVENOUS
  Filled 2022-09-10 (×2): qty 100

## 2022-09-10 MED ORDER — HYDROMORPHONE HCL 1 MG/ML IJ SOLN
0.5000 mg | INTRAMUSCULAR | Status: DC | PRN
  Administered 2022-09-10 – 2022-09-11 (×3): 0.5 mg via INTRAVENOUS
  Filled 2022-09-10 (×5): qty 0.5

## 2022-09-10 MED ORDER — MONTELUKAST SODIUM 10 MG PO TABS
10.0000 mg | ORAL_TABLET | Freq: Every day | ORAL | Status: DC
  Administered 2022-09-10 – 2022-09-11 (×2): 10 mg via ORAL
  Filled 2022-09-10 (×2): qty 1

## 2022-09-10 MED ORDER — ACETAMINOPHEN 500 MG PO TABS
ORAL_TABLET | ORAL | Status: AC
  Filled 2022-09-10: qty 2

## 2022-09-10 MED ORDER — NALOXONE HCL 4 MG/0.1ML NA LIQD
4.0000 mg | Freq: Once | NASAL | Status: DC
  Filled 2022-09-10: qty 8

## 2022-09-10 MED ORDER — THROMBIN 20000 UNITS EX SOLR: CUTANEOUS | Status: DC | PRN

## 2022-09-10 MED ORDER — DEXAMETHASONE SODIUM PHOSPHATE 10 MG/ML IJ SOLN
INTRAMUSCULAR | Status: DC | PRN
  Administered 2022-09-10: 10 mg via INTRAVENOUS

## 2022-09-10 MED ORDER — PROPOFOL 10 MG/ML IV BOLUS
INTRAVENOUS | Status: DC | PRN
  Administered 2022-09-10: 160 mg via INTRAVENOUS
  Administered 2022-09-10: 40 mg via INTRAVENOUS

## 2022-09-10 MED ORDER — CHLORHEXIDINE GLUCONATE CLOTH 2 % EX PADS: 6.0000 | MEDICATED_PAD | Freq: Once | CUTANEOUS | Status: DC

## 2022-09-10 MED ORDER — DEXAMETHASONE SODIUM PHOSPHATE 10 MG/ML IJ SOLN
INTRAMUSCULAR | Status: AC
  Filled 2022-09-10: qty 1

## 2022-09-10 MED ORDER — PHENYLEPHRINE HCL-NACL 20-0.9 MG/250ML-% IV SOLN
INTRAVENOUS | Status: DC | PRN
  Administered 2022-09-10: 20 ug/min via INTRAVENOUS

## 2022-09-10 MED ORDER — SODIUM CHLORIDE 0.9% FLUSH: 3.0000 mL | INTRAVENOUS | Status: DC | PRN

## 2022-09-10 MED ORDER — KETAMINE HCL 10 MG/ML IJ SOLN
INTRAMUSCULAR | Status: DC | PRN
  Administered 2022-09-10: 20 mg via INTRAVENOUS
  Administered 2022-09-10 (×3): 10 mg via INTRAVENOUS

## 2022-09-10 MED ORDER — ROCURONIUM BROMIDE 10 MG/ML (PF) SYRINGE
PREFILLED_SYRINGE | INTRAVENOUS | Status: DC | PRN
  Administered 2022-09-10: 30 mg via INTRAVENOUS
  Administered 2022-09-10: 20 mg via INTRAVENOUS
  Administered 2022-09-10: 30 mg via INTRAVENOUS
  Administered 2022-09-10: 40 mg via INTRAVENOUS

## 2022-09-10 MED ORDER — MENTHOL 3 MG MT LOZG: 1.0000 | LOZENGE | OROMUCOSAL | Status: DC | PRN

## 2022-09-10 MED ORDER — CEFAZOLIN SODIUM-DEXTROSE 2-4 GM/100ML-% IV SOLN
2.0000 g | INTRAVENOUS | Status: AC
  Administered 2022-09-10 (×2): 2 g via INTRAVENOUS

## 2022-09-10 MED ORDER — SUGAMMADEX SODIUM 200 MG/2ML IV SOLN
INTRAVENOUS | Status: DC | PRN
  Administered 2022-09-10: 200 mg via INTRAVENOUS

## 2022-09-10 MED ORDER — PHENYLEPHRINE 80 MCG/ML (10ML) SYRINGE FOR IV PUSH (FOR BLOOD PRESSURE SUPPORT)
PREFILLED_SYRINGE | INTRAVENOUS | Status: AC
  Filled 2022-09-10: qty 10

## 2022-09-10 MED ORDER — ACETAMINOPHEN 650 MG RE SUPP: 650.0000 mg | RECTAL | Status: DC | PRN

## 2022-09-10 MED ORDER — TRAZODONE HCL 50 MG PO TABS: 150.0000 mg | ORAL_TABLET | Freq: Every evening | ORAL | Status: DC | PRN

## 2022-09-10 MED ORDER — PANTOPRAZOLE SODIUM 40 MG PO TBEC: 40.0000 mg | DELAYED_RELEASE_TABLET | Freq: Every day | ORAL | Status: DC

## 2022-09-10 MED ORDER — LIDOCAINE-EPINEPHRINE 1 %-1:100000 IJ SOLN
INTRAMUSCULAR | Status: AC
  Filled 2022-09-10: qty 1

## 2022-09-10 MED ORDER — DEXMEDETOMIDINE HCL IN NACL 80 MCG/20ML IV SOLN
INTRAVENOUS | Status: DC | PRN
  Administered 2022-09-10 (×2): 4 ug via BUCCAL
  Administered 2022-09-10: 2 ug via BUCCAL
  Administered 2022-09-10: 4 ug via BUCCAL

## 2022-09-10 MED ORDER — ONDANSETRON HCL 4 MG/2ML IJ SOLN
4.0000 mg | Freq: Four times a day (QID) | INTRAMUSCULAR | Status: DC | PRN
  Administered 2022-09-10: 4 mg via INTRAVENOUS
  Filled 2022-09-10: qty 2

## 2022-09-10 MED ORDER — PROMETHAZINE HCL 25 MG/ML IJ SOLN
INTRAMUSCULAR | Status: AC
  Administered 2022-09-10: 6.25 mg via INTRAVENOUS
  Filled 2022-09-10: qty 1

## 2022-09-10 MED ORDER — SODIUM CHLORIDE 0.9% FLUSH
3.0000 mL | Freq: Two times a day (BID) | INTRAVENOUS | Status: DC
  Administered 2022-09-11 – 2022-09-12 (×3): 3 mL via INTRAVENOUS

## 2022-09-10 MED ORDER — THROMBIN 5000 UNITS EX SOLR
CUTANEOUS | Status: AC
  Filled 2022-09-10: qty 5000

## 2022-09-10 MED ORDER — ACETAMINOPHEN 500 MG PO TABS: 1000.0000 mg | ORAL_TABLET | Freq: Four times a day (QID) | ORAL | Status: DC | PRN

## 2022-09-10 MED ORDER — HYDROXYZINE PAMOATE 25 MG PO CAPS: 25.0000 mg | ORAL_CAPSULE | Freq: Four times a day (QID) | ORAL | Status: DC | PRN

## 2022-09-10 MED ORDER — LIDOCAINE 2% (20 MG/ML) 5 ML SYRINGE
INTRAMUSCULAR | Status: DC | PRN
  Administered 2022-09-10: 100 mg via INTRAVENOUS

## 2022-09-10 MED ORDER — THROMBIN 20000 UNITS EX SOLR
CUTANEOUS | Status: AC
  Filled 2022-09-10: qty 20000

## 2022-09-10 MED ORDER — SUCCINYLCHOLINE CHLORIDE 200 MG/10ML IV SOSY
PREFILLED_SYRINGE | INTRAVENOUS | Status: DC | PRN
  Administered 2022-09-10: 100 mg via INTRAVENOUS

## 2022-09-10 MED ORDER — SUFENTANIL CITRATE 50 MCG/ML IV SOLN
INTRAVENOUS | Status: DC | PRN
  Administered 2022-09-10: 15 ug via INTRAVENOUS
  Administered 2022-09-10 (×7): 5 ug via INTRAVENOUS

## 2022-09-10 MED ORDER — BUPIVACAINE LIPOSOME 1.3 % IJ SUSP
INTRAMUSCULAR | Status: AC
  Filled 2022-09-10: qty 20

## 2022-09-10 MED ORDER — ROCURONIUM BROMIDE 10 MG/ML (PF) SYRINGE
PREFILLED_SYRINGE | INTRAVENOUS | Status: AC
  Filled 2022-09-10: qty 10

## 2022-09-10 MED ORDER — ONDANSETRON HCL 4 MG/2ML IJ SOLN
INTRAMUSCULAR | Status: DC | PRN
  Administered 2022-09-10 (×2): 4 mg via INTRAVENOUS

## 2022-09-10 MED ORDER — FENTANYL CITRATE (PF) 100 MCG/2ML IJ SOLN
INTRAMUSCULAR | Status: AC
  Administered 2022-09-10: 25 ug via INTRAVENOUS
  Filled 2022-09-10: qty 2

## 2022-09-10 MED ORDER — SODIUM CHLORIDE 0.9 % IV SOLN: 250.0000 mL | INTRAVENOUS | Status: DC

## 2022-09-10 MED ORDER — PROPOFOL 500 MG/50ML IV EMUL
INTRAVENOUS | Status: DC | PRN
  Administered 2022-09-10: 100 ug/kg/min via INTRAVENOUS

## 2022-09-10 MED ORDER — DEXMEDETOMIDINE HCL IN NACL 80 MCG/20ML IV SOLN
INTRAVENOUS | Status: AC
  Filled 2022-09-10: qty 20

## 2022-09-10 MED ORDER — ORAL CARE MOUTH RINSE: 15.0000 mL | Freq: Once | OROMUCOSAL | Status: AC

## 2022-09-10 MED ORDER — MORPHINE SULFATE 15 MG PO TABS
15.0000 mg | ORAL_TABLET | Freq: Four times a day (QID) | ORAL | Status: DC
  Administered 2022-09-10 (×2): 15 mg via ORAL
  Filled 2022-09-10 (×2): qty 1

## 2022-09-10 MED ORDER — CEFAZOLIN SODIUM 1 G IJ SOLR
INTRAMUSCULAR | Status: AC
  Filled 2022-09-10: qty 20

## 2022-09-10 MED ORDER — PHENOL 1.4 % MT LIQD: 1.0000 | OROMUCOSAL | Status: DC | PRN

## 2022-09-10 MED ORDER — PROMETHAZINE HCL 25 MG/ML IJ SOLN
6.2500 mg | INTRAMUSCULAR | Status: AC | PRN
  Administered 2022-09-10: 6.25 mg via INTRAVENOUS

## 2022-09-10 MED ORDER — MIDAZOLAM HCL 2 MG/2ML IJ SOLN
INTRAMUSCULAR | Status: AC
  Filled 2022-09-10: qty 2

## 2022-09-10 MED ORDER — VITAMIN D3 25 MCG (1000 UNIT) PO TABS
5000.0000 [IU] | ORAL_TABLET | Freq: Every day | ORAL | Status: DC
  Administered 2022-09-11 – 2022-09-12 (×2): 5000 [IU] via ORAL
  Filled 2022-09-10 (×5): qty 5

## 2022-09-10 MED ORDER — TOPIRAMATE 100 MG PO TABS
200.0000 mg | ORAL_TABLET | Freq: Every day | ORAL | Status: DC
  Administered 2022-09-10 – 2022-09-12 (×3): 200 mg via ORAL
  Filled 2022-09-10 (×3): qty 2

## 2022-09-10 MED ORDER — MIRABEGRON ER 50 MG PO TB24
50.0000 mg | ORAL_TABLET | Freq: Every day | ORAL | Status: DC
  Administered 2022-09-10 – 2022-09-11 (×2): 50 mg via ORAL
  Filled 2022-09-10 (×3): qty 1

## 2022-09-10 MED ORDER — BIOTIN 2.5 MG PO TABS: 12.0000 mg | ORAL_TABLET | Freq: Every day | ORAL | Status: DC

## 2022-09-10 MED ORDER — HYDROCODONE-ACETAMINOPHEN 5-325 MG PO TABS
2.0000 | ORAL_TABLET | ORAL | Status: DC | PRN
  Administered 2022-09-11 – 2022-09-12 (×3): 2 via ORAL
  Filled 2022-09-10 (×3): qty 2

## 2022-09-10 MED ORDER — 0.9 % SODIUM CHLORIDE (POUR BTL) OPTIME
TOPICAL | Status: DC | PRN
  Administered 2022-09-10: 1000 mL

## 2022-09-10 MED ORDER — MORPHINE SULFATE ER 15 MG PO TBCR
15.0000 mg | EXTENDED_RELEASE_TABLET | Freq: Three times a day (TID) | ORAL | Status: DC
  Administered 2022-09-10 – 2022-09-12 (×6): 15 mg via ORAL
  Filled 2022-09-10 (×6): qty 1

## 2022-09-10 MED ORDER — ALUM & MAG HYDROXIDE-SIMETH 200-200-20 MG/5ML PO SUSP: 30.0000 mL | Freq: Four times a day (QID) | ORAL | Status: DC | PRN

## 2022-09-10 MED ORDER — ACETAMINOPHEN 325 MG PO TABS: 650.0000 mg | ORAL_TABLET | ORAL | Status: DC | PRN

## 2022-09-10 MED ORDER — LEVOCETIRIZINE DIHYDROCHLORIDE 5 MG PO TABS: 5.0000 mg | ORAL_TABLET | Freq: Every day | ORAL | Status: DC

## 2022-09-10 MED ORDER — THROMBIN 5000 UNITS EX SOLR: OROMUCOSAL | Status: DC | PRN

## 2022-09-10 MED ORDER — BUPIVACAINE LIPOSOME 1.3 % IJ SUSP
INTRAMUSCULAR | Status: DC | PRN
  Administered 2022-09-10: 20 mL

## 2022-09-10 MED ORDER — ACETAMINOPHEN 500 MG PO TABS
1000.0000 mg | ORAL_TABLET | Freq: Once | ORAL | Status: AC
  Administered 2022-09-10: 1000 mg via ORAL

## 2022-09-10 MED ORDER — KETAMINE HCL 50 MG/5ML IJ SOSY
PREFILLED_SYRINGE | INTRAMUSCULAR | Status: AC
  Filled 2022-09-10: qty 10

## 2022-09-10 SURGICAL SUPPLY — 70 items
ADH SKN CLS APL DERMABOND .7 (GAUZE/BANDAGES/DRESSINGS) ×4
ADH SKN CLS LQ APL DERMABOND (GAUZE/BANDAGES/DRESSINGS) ×2
APL SKNCLS STERI-STRIP NONHPOA (GAUZE/BANDAGES/DRESSINGS) ×2
BAG COUNTER SPONGE SURGICOUNT (BAG) ×2 IMPLANT
BAG SPNG CNTER NS LX DISP (BAG) ×6
BENZOIN TINCTURE PRP APPL 2/3 (GAUZE/BANDAGES/DRESSINGS) ×4 IMPLANT
BLADE CLIPPER SURG (BLADE) IMPLANT
BLADE EXTENDER LTP N26 DISP (ORTHOPEDIC DISPOSABLE SUPPLIES) IMPLANT
BONE MATRIX OSTEOCEL PRO MED (Bone Implant) IMPLANT
BONE VIVIGEN FORMABLE 10CC (Bone Implant) ×2 IMPLANT
CAP PUSHER PTP (ORTHOPEDIC DISPOSABLE SUPPLIES) IMPLANT
CLIP SPRING STIM LLIF SAFEOP (CLIP) ×4 IMPLANT
DERMABOND ADVANCED .7 DNX12 (GAUZE/BANDAGES/DRESSINGS) ×4 IMPLANT
DERMABOND ADVANCED .7 DNX6 (GAUZE/BANDAGES/DRESSINGS) IMPLANT
DILATOR INSULATED SAFEOP OVAL (NEUROSURGERY SUPPLIES) IMPLANT
DRAPE C-ARM 42X72 X-RAY (DRAPES) ×2 IMPLANT
DRAPE C-ARMOR (DRAPES) ×2 IMPLANT
DRAPE LAPAROTOMY 100X72X124 (DRAPES) ×2 IMPLANT
DRAPE SURG 17X23 STRL (DRAPES) ×4 IMPLANT
DRSG OPSITE 4X5.5 SM (GAUZE/BANDAGES/DRESSINGS) IMPLANT
DRSG OPSITE POSTOP 3X4 (GAUZE/BANDAGES/DRESSINGS) IMPLANT
DRSG OPSITE POSTOP 4X6 (GAUZE/BANDAGES/DRESSINGS) IMPLANT
ELECT KIT SAFEOP SSEP/SURF (KITS) ×2
ELECT REM PT RETURN 9FT ADLT (ELECTROSURGICAL) ×4
ELECTRODE KT SAFEOP SSEP/SURF (KITS) IMPLANT
ELECTRODE REM PT RTRN 9FT ADLT (ELECTROSURGICAL) ×2 IMPLANT
EVACUATOR 1/8 PVC DRAIN (DRAIN) IMPLANT
FORCEPS LIF BIPOLAR STL (ORTHOPEDIC DISPOSABLE SUPPLIES) IMPLANT
GAUZE SPONGE 4X4 12PLY STRL (GAUZE/BANDAGES/DRESSINGS) IMPLANT
GLOVE BIO SURGEON STRL SZ7 (GLOVE) IMPLANT
GLOVE BIO SURGEON STRL SZ8 (GLOVE) ×2 IMPLANT
GLOVE BIOGEL PI IND STRL 7.0 (GLOVE) IMPLANT
GLOVE INDICATOR 8.5 STRL (GLOVE) ×4 IMPLANT
GOWN STRL REUS W/ TWL LRG LVL3 (GOWN DISPOSABLE) IMPLANT
GOWN STRL REUS W/ TWL XL LVL3 (GOWN DISPOSABLE) ×2 IMPLANT
GOWN STRL REUS W/TWL LRG LVL3 (GOWN DISPOSABLE) ×8
GOWN STRL REUS W/TWL XL LVL3 (GOWN DISPOSABLE) ×8
GRAFT BNE MATRIX VG FRMBL L 10 (Bone Implant) IMPLANT
GUIDEWIRE LLIF TT 320 (WIRE) IMPLANT
HEMOSTAT POWDER KIT SURGIFOAM (HEMOSTASIS) ×2 IMPLANT
KIT BASIN OR (CUSTOM PROCEDURE TRAY) ×2 IMPLANT
KIT INFUSE X SMALL 1.4CC (Orthopedic Implant) IMPLANT
KIT TURNOVER KIT B (KITS) ×2 IMPLANT
KNIFE ANNULOTOMY (BLADE) IMPLANT
LIF ILLUMINATION SYSTEM STERIL (SYSTAGENIX WOUND MANAGEMENT) ×2
NDL HYPO 25X1 1.5 SAFETY (NEEDLE) ×2 IMPLANT
NEEDLE HYPO 25X1 1.5 SAFETY (NEEDLE) ×4 IMPLANT
NS IRRIG 1000ML POUR BTL (IV SOLUTION) ×2 IMPLANT
PACK LAMINECTOMY NEURO (CUSTOM PROCEDURE TRAY) ×2 IMPLANT
PROBE BALL TIP LLIF SAFEOP (NEUROSURGERY SUPPLIES) IMPLANT
ROD 200MM (Rod) ×4 IMPLANT
ROD SPNL 200X5.5XNS LF TI (Rod) IMPLANT
SCREW LOCK RELINE 5.5 TULIP (Screw) IMPLANT
SCREW POLYAXIAL RELINE 8.5X40 (Screw) IMPLANT
SCREW PRECEPT SET (Screw) IMPLANT
SCREW RELINE POLY 4.5X40MM (Screw) ×4 IMPLANT
SCREW RLINE PLY 2S 40X4.5XPA (Screw) IMPLANT
SHIM INTRADISCAL LTP N DISP (ORTHOPEDIC DISPOSABLE SUPPLIES) IMPLANT
SPACER IDENTI LIF 8X16X40 0D (Spacer) IMPLANT
STRIP CLOSURE SKIN 1/2X4 (GAUZE/BANDAGES/DRESSINGS) ×2 IMPLANT
SUT VIC AB 0 CT1 18XCR BRD8 (SUTURE) IMPLANT
SUT VIC AB 0 CT1 8-18 (SUTURE) ×2
SUT VIC AB 2-0 CT1 18 (SUTURE) ×2 IMPLANT
SUT VICRYL 4-0 PS2 18IN ABS (SUTURE) ×4 IMPLANT
SYSTEM ILLUMINATION LIF STERIL (SYSTAGENIX WOUND MANAGEMENT) IMPLANT
TEMPLATE ROD SILICON 250 (ORTHOPEDIC DISPOSABLE SUPPLIES) IMPLANT
TOWEL GREEN STERILE (TOWEL DISPOSABLE) ×2 IMPLANT
TOWEL GREEN STERILE FF (TOWEL DISPOSABLE) ×2 IMPLANT
TRAY FOLEY MTR SLVR 16FR STAT (SET/KITS/TRAYS/PACK) ×2 IMPLANT
WATER STERILE IRR 1000ML POUR (IV SOLUTION) ×2 IMPLANT

## 2022-09-10 NOTE — Anesthesia Procedure Notes (Signed)
Arterial Line Insertion Start/End9/25/2023 9:18 AM, 09/10/2022 9:25 AM Performed by: Santa Lighter, MD, Janace Litten, CRNA, CRNA  Patient location: Pre-op. Preanesthetic checklist: patient identified, IV checked, site marked, risks and benefits discussed, surgical consent, monitors and equipment checked, pre-op evaluation, timeout performed and anesthesia consent Lidocaine 1% used for infiltration Left, radial was placed Catheter size: 20 G Hand hygiene performed  and maximum sterile barriers used  Allen's test indicative of satisfactory collateral circulation Attempts: 2 Procedure performed without using ultrasound guided technique. Ultrasound Notes:anatomy identified, needle tip was noted to be adjacent to the nerve/plexus identified and no ultrasound evidence of intravascular and/or intraneural injection Following insertion, dressing applied and Biopatch. Post procedure assessment: normal and unchanged  Patient tolerated the procedure well with no immediate complications.

## 2022-09-10 NOTE — Progress Notes (Signed)
Patient ID: Laura Ho, female   DOB: [birth year], 51 y.o.   MRN: [id number] Patient has a spinal stimulator in place and we have talked about in the office removal of the stimulator however this did not make it on the consent.  I had discussed with the patient I did confirm with the patient's family as we have finished stage one of the procedure we have now flipped and performing stage II with this posterior screw augmentation.  We will go ahead and remove the dorsal column stimulator and generator I did explain that the leads may not be immediately removed but I would remove as much as I could safely.  And I did discuss this extensively with her in the office and again just now with the family.

## 2022-09-10 NOTE — Anesthesia Procedure Notes (Signed)
Procedure Name: Intubation Date/Time: 09/10/2022 11:11 AM  Performed by: Carolan Clines, CRNAPre-anesthesia Checklist: Patient identified, Emergency Drugs available, Suction available and Patient being monitored Patient Re-evaluated:Patient Re-evaluated prior to induction Oxygen Delivery Method: Circle System Utilized Preoxygenation: Pre-oxygenation with 100% oxygen Induction Type: IV induction Ventilation: Mask ventilation without difficulty Laryngoscope Size: Glidescope and 3 Grade View: Grade I Tube type: Oral Tube size: 7.0 mm Number of attempts: 1 Airway Equipment and Method: Oral airway, Rigid stylet and Video-laryngoscopy Placement Confirmation: ETT inserted through vocal cords under direct vision, positive ETCO2 and breath sounds checked- equal and bilateral Secured at: 22 cm Tube secured with: Tape Dental Injury: Teeth and Oropharynx as per pre-operative assessment

## 2022-09-10 NOTE — Transfer of Care (Signed)
Immediate Anesthesia Transfer of Care Note  Patient: Laura Ho  Procedure(s) Performed: LUMBAR ONE-TWO ANTERIOR LATERAL LUMBAR FUSION (Right: Spine Lumbar) LUMBAR ONE-TWO CORTICAL SCREW PLACEMENT WITH REVISION OF LUMBAR FIVE-SACRAL ONE HARDWARE (Spine Lumbar) LUMBAR SPINAL CORD SIMULATOR LEAD REMOVAL (Spine Lumbar)  Patient Location: PACU  Anesthesia Type:General  Level of Consciousness: drowsy  Airway & Oxygen Therapy: Patient Spontanous Breathing and Patient connected to face mask oxygen  Post-op Assessment: Report given to RN and Post -op Vital signs reviewed and stable  Post vital signs: Reviewed and stable  Last Vitals:  Vitals Value Taken Time  BP 117/72 09/10/22 1621  Temp    Pulse 95 09/10/22 1630  Resp 20 09/10/22 1630  SpO2 97 % 09/10/22 1630  Vitals shown include unvalidated device data.  Last Pain:  Vitals:   09/10/22 0845  TempSrc:   PainSc: 9       Patients Stated Pain Goal: 3 (0-3 per patient) (65/78/46 9629)  Complications: No notable events documented.

## 2022-09-10 NOTE — Op Note (Signed)
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  Pseudoarthrosis L5-S1.  Postoperative diagnosis: Same.  Procedure: #1 anterior lateral interbody fusion L1-L2 utilizing the Alphatec titanium cage packed with the Osteocel pro with intraoperative neuro monitoring.  2.  After repositioning through a separate skin incision exploration fusion with removal of hardware with removal of nuts rods and loose bilateral S1 screws.  3.  Placement of new screws at L1 utilizing the NuVasive reline system for new screws at L1 and new screws at S1 corresponding nuts for reline and replacement knots for the existing precept system.  4.  Posterior lateral arthrodesis L1-L2 and redo posterior lateral arthrodesis L5-S1 utilizing locally harvested autograft mixed with BMP and Vivigen.  5.  Removal of dorsal column stimulator with removal of generator and leads.  Surgeon: Kary Kos.  Assistant: Nash Shearer  Anesthesia: General  EBL: 300.  HPI: 51 year old female previously undergone an L2-S1 fusion and presented with progressive worsening back and bilateral hip and leg pain work-up revealed segmental degeneration above her fusion L1-L2 with spinal stenosis at that level as well as loosening of bilateral S1 screws consistent with pseudoarthrosis at L5-S1.  Due to patient's progression of clinical syndrome imaging findings of a conservative treatment I recommended anterior lateral interbody fusion L1-L2 and then posterior augmentation with pedicle screws at L1 and redo instrumented arthrodesis at L5-S1.  I extensively went over the risk and benefits of that operation with her as well as perioperative course expectations of outcome and alternatives of surgery and she understood and agreed to proceed forward.  Operative procedure: Patient was brought into the OR was induced under general anesthesia positioned right side up for the anterior lateral exposure.  2  incision technique with utilizing fluoroscopy to localize and I had to resect part of the 11th rib and then dissected on the undersurface of the rib freeing up the pleura in the retroperitoneal space down to the lateral aspect of the lumbar spine the posterior incision extensive my scar tissue around the old hardware made it difficult to palpate the transverse process and get up behind the ribs.  So this was mostly done under direct visualization through the lateral incision pass the dilators over my finger down and docked under fluoroscopy on the 30 yard line.  Then through sequential dilation the retractor was placed and through elective monitoring and stimulation confirm these were all safe zones locked the retractor in place and opened up deployed the shim cleaned out of the disc base with pituitary rongeurs Cobb scrapers rasps and then dilated the disc base up and sized up an 8 mm tall 16 mm wide 0 degree cage and inserted this under slides under fluoroscopy.  Both AP and lateral fluoroscopy confirmed good position of the implant.  Both incisions were then irrigated and closed with interrupted Vicryl and a running 4 subcuticular.  Patient was then repositioned prone and then we placed a midline incision her back after prepping and draping her posterior incisions as well as incision over the dorsal column stimulator generator so my assistant and I sequentially remove the generator opened up the incision exposed the hardware remove the leads easily and remove the entire stimulator EXTR found identified the hardware from L2 down to S1 disconnected the knots remove the rods and remove the loose S1 screws.  All the screws appear to be solid then dissected out the pedicle screw entry point for L1.  Under AP and lateral fluoroscopy pedicle screws were  placed at L1 bilaterally all screws excellent purchase.  We then exposed and decorticated aggressively the facet joints lateral pars at L1-L2 as well as the sacral ala  and transverse process at L1 and facet joint at L5-S1.  And then packed BMP and Vivigen and autograft posterior laterally at L5-S1 then under fluoroscopy replaced to S1 screws with screws twice is large.  Then after all screws were in place fluoroscopy confirmed good position of all the implants.  Then the rods were contoured and anchored in place L1 was compressed against L2 and everything was tightened down.  I placed some additional bone graft posterior laterally at L1-L2 along the facet joints and lateral pars.  Then injected Exparel in the fascia placed a medium Hemovac drain and closed the wound in layers with interrupted Vicryl and a running 4 subcuticular.  Dermabond benzoin Steri-Strips and a sterile dressing was applied patient recovery in stable condition.  At the end the case all needle counts and sponge counts were correct.

## 2022-09-10 NOTE — Anesthesia Procedure Notes (Signed)
Central Venous Catheter Insertion Performed by: Santa Lighter, MD, anesthesiologist Start/End9/25/2023 11:15 AM, 09/10/2022 11:25 AM Patient location: OR. Preanesthetic checklist: patient identified, IV checked, site marked, risks and benefits discussed, surgical consent, monitors and equipment checked, pre-op evaluation, timeout performed and anesthesia consent Position: Trendelenburg Lidocaine 1% used for infiltration and patient sedated Hand hygiene performed , maximum sterile barriers used  and Seldinger technique used Catheter size: 8 Fr Total catheter length 16. Central line was placed.Double lumen Procedure performed using ultrasound guided technique. Ultrasound Notes:anatomy identified, needle tip was noted to be adjacent to the nerve/plexus identified, no ultrasound evidence of intravascular and/or intraneural injection and image(s) printed for medical record Attempts: 1 Following insertion, line sutured, dressing applied and Biopatch. Post procedure assessment: blood return through all ports, free fluid flow and no air  Patient tolerated the procedure well with no immediate complications.

## 2022-09-10 NOTE — H&P (Signed)
Laura Ho is an 51 y.o. female.   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  Due to patient progression of clinical syndrome imaging findings of a conservative treatment I recommended anterior lateral interbody fusion L1-L2 as well as redo posterior lateral instrumented fusion at L5-S1.  I have extensively gone over the risks and benefits of that procedure with her as well as perioperative course expectations of outcome and terms of surgery and she understood and agreed to proceed forward.  Past Medical History:  Diagnosis Date   Chronic back pain    Cyclical vomiting syndrome    Diverticulitis    Family history of adverse reaction to anesthesia    Father and sister have N & V   Hemorrhoid     Past Surgical History:  Procedure Laterality Date   ABDOMINAL HYSTERECTOMY     APPENDECTOMY     BACK SURGERY     CHOLECYSTECTOMY     HEMORRHOID SURGERY     [place]   TONSILLECTOMY  1983    History reviewed. No pertinent family history. Social History:  reports that she quit smoking about 3 years ago. Her smoking use included cigarettes. She smoked an average of 1 pack per day. She has never used smokeless tobacco. She reports that she does not drink alcohol and does not use drugs.  Allergies:  Allergies  Allergen Reactions   Bee Venom Anaphylaxis, Shortness Of Breath and Hives   Coconut Oil Itching   Sulfa Antibiotics Anaphylaxis   Biaxin [Clarithromycin]     hives   Ciprofloxacin Diarrhea    Other reaction(s): Abdominal Pain    Medications Prior to Admission  Medication Sig Dispense Refill   acetaminophen (TYLENOL) 500 MG tablet Take 1,000 mg by mouth every 6 (six) hours as needed for mild pain or moderate pain.     BIOTIN PO Take 12,000 mcg by mouth daily. 6000 mcg each     Cholecalciferol (VITAMIN  D-3) 125 MCG (5000 UT) TABS Take 5,000 Units by mouth daily.     DULoxetine (CYMBALTA) 60 MG capsule Take 60 mg by mouth 2 (two) times daily.     hydrOXYzine (VISTARIL) 25 MG capsule Take 25 mg by mouth every 6 (six) hours as needed for anxiety.     levocetirizine (XYZAL) 5 MG tablet Take 5 mg by mouth at bedtime.     mirabegron ER (MYRBETRIQ) 50 MG TB24 tablet Take 50 mg by mouth at bedtime.     montelukast (SINGULAIR) 10 MG tablet Take 10 mg by mouth at bedtime.     morphine (MS CONTIN) 15 MG 12 hr tablet Take 15 mg by mouth 3 (three) times daily.     morphine (MSIR) 15 MG tablet Take 15 mg by mouth 4 (four) times daily.     naloxone (NARCAN) nasal spray 4 mg/0.1 mL 4 mg.     omeprazole (PRILOSEC) 40 MG capsule Take 40 mg by mouth at bedtime.     tiZANidine (ZANAFLEX) 4 MG tablet Take 4 mg by mouth every 8 (eight) hours as needed for muscle spasms.     topiramate (TOPAMAX) 200 MG tablet Take 200 mg by mouth daily.     traZODone (DESYREL) 50 MG tablet Take 150-200 mg by mouth at bedtime as needed for sleep.     ondansetron (ZOFRAN ODT) 4 MG disintegrating tablet Take  1 tablet (4 mg total) by mouth every 8 (eight) hours as needed for nausea. (Patient taking differently: Take 4 mg by mouth 3 (three) times daily as needed for nausea.) 10 tablet 0    Results for orders placed or performed during the hospital encounter of 09/10/22 (from the past 48 hour(s))  Type and screen [place]     Status: None (Preliminary result)   Collection Time: 09/10/22  8:55 AM  Result Value Ref Range   ABO/RH(D) PENDING    Antibody Screen PENDING    Sample Expiration      09/13/2022,2359 Performed at [facility], [place] [address], [place], [place] [address]    No results found.  Review of Systems  Musculoskeletal:  Positive for back pain.  Neurological:  Positive for numbness.    Blood pressure 129/86, pulse 94, temperature 98.1 F (36.7 C), temperature source Oral, resp. rate 18,  height 5\' 6"  (1.676 m), weight 60.3 kg, SpO2 97 %. Physical Exam HENT:     Head: Normocephalic.     Right Ear: Tympanic membrane normal.     Nose: Nose normal.     Mouth/Throat:     Mouth: Mucous membranes are moist.  Eyes:     Pupils: Pupils are equal, round, and reactive to light.  Cardiovascular:     Rate and Rhythm: Normal rate.  Pulmonary:     Effort: Pulmonary effort is normal.  Abdominal:     General: Abdomen is flat.  Musculoskeletal:     Cervical back: Normal range of motion.  Neurological:     Mental Status: She is alert.     Comments: Patient awake alert strength is 5 out of 5 iliopsoas, quads, hamstrings, gastroc, into tibialis, and EHL.      Assessment/Plan 51 year old presents for anterior lateral interbody fusion L1-L2 and redo instrumented posterolateral arthrodesis L5-S1 for pseudoarthrosis  Elaina Hoops, MD 09/10/2022, 9:51 AM

## 2022-09-10 NOTE — Anesthesia Preprocedure Evaluation (Addendum)
Anesthesia Evaluation  Patient identified by MRN, date of birth, ID band Patient awake    Reviewed: Allergy & Precautions, NPO status , Patient's Chart, lab work & pertinent test results  Airway Mallampati: II  TM Distance: >3 FB Neck ROM: Full    Dental  (+) Teeth Intact, Dental Advisory Given   Pulmonary former smoker,    Pulmonary exam normal breath sounds clear to auscultation       Cardiovascular negative cardio ROS Normal cardiovascular exam Rhythm:Regular Rate:Normal     Neuro/Psych Spondylosis negative psych ROS   GI/Hepatic Neg liver ROS, GERD  Medicated,  Endo/Other  negative endocrine ROS  Renal/GU negative Renal ROS     Musculoskeletal negative musculoskeletal ROS (+)   Abdominal   Peds  Hematology negative hematology ROS (+)   Anesthesia Other Findings Day of surgery medications reviewed with the patient.  Reproductive/Obstetrics                             Anesthesia Physical Anesthesia Plan  ASA: 2  Anesthesia Plan: General   Post-op Pain Management: Tylenol PO (pre-op)*   Induction: Intravenous  PONV Risk Score and Plan: 3 and Midazolam, Dexamethasone, Ondansetron and Propofol infusion  Airway Management Planned: Oral ETT  Additional Equipment: Arterial line  Intra-op Plan:   Post-operative Plan: Possible Post-op intubation/ventilation  Informed Consent: I have reviewed the patients History and Physical, chart, labs and discussed the procedure including the risks, benefits and alternatives for the proposed anesthesia with the patient or authorized representative who has indicated his/her understanding and acceptance.     Dental advisory given  Plan Discussed with: CRNA  Anesthesia Plan Comments: (2nd PIV)       Anesthesia Quick Evaluation

## 2022-09-10 NOTE — Progress Notes (Signed)
Orthopedic Tech Progress Note Patient Details:  Laura Ho 1971-09-27 [id number]  Ortho Devices Type of Ortho Device: Lumbar corsett Ortho Device/Splint Location: BACK Ortho Device/Splint Interventions: Ordered, Adjustment   Post Interventions Patient Tolerated: Well Instructions Provided: Care of [name] 09/10/2022, 4:58 PM

## 2022-09-11 MED ORDER — SODIUM CHLORIDE 0.9% FLUSH: 10.0000 mL | Freq: Two times a day (BID) | INTRAVENOUS | Status: DC

## 2022-09-11 MED ORDER — PANTOPRAZOLE SODIUM 40 MG PO TBEC
40.0000 mg | DELAYED_RELEASE_TABLET | Freq: Every day | ORAL | Status: DC
  Administered 2022-09-11: 40 mg via ORAL
  Filled 2022-09-11: qty 1

## 2022-09-11 MED ORDER — SODIUM CHLORIDE 0.9% FLUSH: 10.0000 mL | INTRAVENOUS | Status: DC | PRN

## 2022-09-11 MED ORDER — MORPHINE SULFATE 15 MG PO TABS
15.0000 mg | ORAL_TABLET | Freq: Four times a day (QID) | ORAL | Status: DC
  Administered 2022-09-11 – 2022-09-12 (×7): 15 mg via ORAL
  Filled 2022-09-11 (×7): qty 1

## 2022-09-11 MED ORDER — CHLORHEXIDINE GLUCONATE CLOTH 2 % EX PADS
6.0000 | MEDICATED_PAD | Freq: Every day | CUTANEOUS | Status: DC
  Administered 2022-09-11: 6 via TOPICAL

## 2022-09-11 NOTE — Progress Notes (Signed)
Subjective: Patient reports severe back pain but no leg pain   Objective: Vital signs in last 24 hours: Temp:  [97.5 F (36.4 C)-98.3 F (36.8 C)] 98.3 F (36.8 C) (09/26 0413) Pulse Rate:  [90-100] 96 (09/26 0413) Resp:  [15-24] 18 (09/26 0413) BP: (118-162)/(76-97) 118/79 (09/26 0413) SpO2:  [94 %-100 %] 98 % (09/26 0413) Arterial Line BP: (139)/(60) 139/60 (09/25 1630) Weight:  [60.3 kg] 60.3 kg (09/25 0807)  Intake/Output from previous day: 09/25 0701 - 09/26 0700 In: 1100 [I.V.:1100] Out: 3305 [Urine:2850; Drains:155; Blood:300] Intake/Output this shift: No intake/output data recorded.  Neurologic: Grossly normal  Lab Results: Lab Results  Component Value Date   WBC 6.1 09/10/2022   HGB 12.2 09/10/2022   HCT 36.0 09/10/2022   MCV 103.9 (H) 09/10/2022   PLT 276 09/10/2022   No results found for: "INR", "PROTIME" BMET Lab Results  Component Value Date   NA 138 09/10/2022   K 3.3 (L) 09/10/2022   CL 109 09/05/2022   CO2 23 09/05/2022   GLUCOSE 87 09/05/2022   BUN 13 09/05/2022   CREATININE 0.71 09/05/2022   CALCIUM 8.9 09/05/2022    Studies/Results: DG CHEST PORT 1 VIEW  Result Date: 09/10/2022 CLINICAL DATA:  Central line placement EXAM: PORTABLE CHEST 1 VIEW COMPARISON:  06/27/2005 FINDINGS: Right IJ central venous catheter tip over the SVC. No visible pneumothorax. No acute airspace disease. Normal cardiac size. Aortic atherosclerosis. Lead or catheter tubing over the lower chest and extending to the upper lumbar region. IMPRESSION: 1. Right-sided central venous catheter tip over the SVC. No pneumothorax 2. Clear lung fields Electronically Signed   By: Donavan Foil M.D.   On: 09/10/2022 17:13   DG Lumbar Spine 2-3 Views  Result Date: 09/10/2022 CLINICAL DATA:  51 year old female with lumbar fusion EXAM: LUMBAR SPINE - 2-3 VIEW; DG C-ARM 1-60 MIN-NO REPORT COMPARISON:  01/07/2013, 04/27/2022 FINDINGS: Intraoperative fluoroscopic spot images during lumbar  spine surgery. Sequence of 6 fluoroscopic images demonstrates placement of interbody spacer at L1-L2, previously existing bilateral pedicle screws spanning L2-S1, with revision of the bilateral rod fixation. IMPRESSION: Limited intraoperative fluoroscopic spot images demonstrates revision of prior L2-S1 PLIF/bilateral pedicle screw and rod fixation, now to include L1-S1 as above. Please refer to the dictated operative report for full details of intraoperative findings and procedure. Electronically Signed   By: Corrie Mckusick D.O.   On: 09/10/2022 15:45   DG C-Arm 1-60 Min-No Report  Result Date: 09/10/2022 Fluoroscopy was utilized by the requesting physician.  No radiographic interpretation.   DG C-Arm 1-60 Min-No Report  Result Date: 09/10/2022 Fluoroscopy was utilized by the requesting physician.  No radiographic interpretation.   DG C-Arm 1-60 Min-No Report  Result Date: 09/10/2022 Fluoroscopy was utilized by the requesting physician.  No radiographic interpretation.   DG C-Arm 1-60 Min-No Report  Result Date: 09/10/2022 Fluoroscopy was utilized by the requesting physician.  No radiographic interpretation.   DG C-Arm 1-60 Min-No Report  Result Date: 09/10/2022 Fluoroscopy was utilized by the requesting physician.  No radiographic interpretation.    Assessment/Plan: Postop day 1 extension of fusion to L1-2 and redo posterior lateral fusion L5-S1. Work with therapy and pain control today. Likely discharge home tomorrow.    LOS: 1 day    Laura Ho [facility] 09/11/2022, 7:44 AM

## 2022-09-11 NOTE — Anesthesia Postprocedure Evaluation (Signed)
Anesthesia Post Note  Patient: Administrator, [employer]  Procedure(s) Performed: LUMBAR ONE-TWO ANTERIOR LATERAL LUMBAR FUSION (Right: Spine Lumbar) LUMBAR ONE-TWO CORTICAL SCREW PLACEMENT WITH REVISION OF LUMBAR FIVE-SACRAL ONE HARDWARE (Spine Lumbar) LUMBAR SPINAL CORD SIMULATOR LEAD REMOVAL (Spine Lumbar)     Patient location during evaluation: PACU Anesthesia Type: General Level of consciousness: awake and alert Pain management: pain level controlled Vital Signs Assessment: post-procedure vital signs reviewed and stable Respiratory status: spontaneous breathing, nonlabored ventilation, respiratory function stable and patient connected to nasal cannula oxygen Cardiovascular status: blood pressure returned to baseline and stable Postop Assessment: no apparent nausea or vomiting Anesthetic complications: no   No notable events documented.  Last Vitals:  Vitals:   09/11/22 0413 09/11/22 0841  BP: 118/79 (!) 148/93  Pulse: 96 94  Resp: 18 16  Temp: 36.8 C 37.4 C  SpO2: 98% 96%    Last Pain:  Vitals:   09/11/22 0841  TempSrc: Oral  PainSc:                  Laura Ho

## 2022-09-11 NOTE — Evaluation (Signed)
Occupational Therapy Evaluation Patient Details Name: Laura Ho MRN: [id number] DOB: [birth year] Today's Date: 09/11/2022   History of Present Illness 51 yo female s/p  anterior lateral interbody fusion L1-L2 and redo instrumented posterolateral arthrodesis L5-S1 for pseudoarthrosis, removal dorsal column stimulator on 9/25. PMH includes diverticulitis, former smoker, previous fusion.   Clinical Impression   PTA, pt was living alone and was "having a hard time" performing ADLs due to back pain; friend plans on staying at dc. Currently, pt requires Min Guard for ADLs and functional mobility using RW; Min A to manage brace. Provided education and handout on back precautions, bed mobility, brace management, LB ADLs with reacher, toileting, and shower transfer with RW and seat. Pt would benefit from further acute OT to facilitate safe dc. Recommend dc according to physician's plan for home with HHOT vs SNF for post-acute rehab.     Recommendations for follow up therapy are one component of a multi-disciplinary discharge planning process, led by the attending physician.  Recommendations may be updated based on patient status, additional functional criteria and insurance authorization.   Follow Up Recommendations  Follow physician's recommendations for discharge plan and follow up therapies    Assistance Recommended at Discharge Frequent or constant Supervision/Assistance  Patient can return home with the following      Functional Status Assessment  Patient has had a recent decline in their functional status and demonstrates the ability to make significant improvements in function in a reasonable and predictable amount of time.  Equipment Recommendations  None recommended by OT    Recommendations for Other Services PT consult     Precautions / Restrictions Precautions Precautions: Fall;Back Precaution Booklet Issued: Yes (comment) Precaution Comments: Able to recall 3/3 back  precautions Required Braces or Orthoses: Knee Immobilizer - Right;Spinal Brace Knee Immobilizer - Right: On at all times Spinal Brace: Lumbar corset;Applied in sitting position Restrictions Weight Bearing Restrictions: Yes RLE Weight Bearing: Non weight bearing Other Position/Activity Restrictions: Pt reporting she had recent knee surgery and is required to wear the KI. No documented WB status per patient orders. 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 Sallyanne Havers). Informed her she needs to remain non-weight bearing and stay in knee immobilizer. KI fits well. F/U in 2 weeks for 2 v knee XR.... I told her she will need to remain NWB for 6+ weeks"      Mobility Bed Mobility Overal bed mobility: Needs Assistance Bed Mobility: Rolling, Sidelying to Sit, Sit to Sidelying Rolling: Min guard Sidelying to sit: Min guard     Sit to sidelying: Min guard General bed mobility comments: Reviewing log roll technique. Pt performing with cues    Transfers Overall transfer level: Needs assistance Equipment used: Rolling walker (2 wheels) Transfers: Sit to/from Stand Sit to Stand: Min guard           General transfer comment: MIn guard A for safety      Balance Overall balance assessment: Needs assistance Sitting-balance support: No upper extremity supported, Feet supported Sitting balance-Leahy Scale: Fair     Standing balance support: Bilateral upper extremity supported, During functional activity, Reliant on assistive device for balance Standing balance-Leahy Scale: Poor                             ADL either performed or assessed with  clinical judgement   ADL Overall ADL's : Needs assistance/impaired Eating/Feeding: Set up;Sitting   Grooming: Supervision/safety;Set up;Sitting   Upper Body Bathing: Supervision/  safety;Set up;Sitting   Lower Body Bathing: Min guard;Sit to/from stand   Upper Body Dressing : Minimal assistance;Sitting;With caregiver independent assisting Upper Body Dressing Details (indicate cue type and reason): Min A for donning brace Lower Body Dressing: Min guard;Sit to/from stand;With adaptive equipment Lower Body Dressing Details (indicate cue type and reason): donning pants with reacher. demonstrating understanding Toilet Transfer: Min guard;Ambulation;Rolling walker (2 wheels) Toilet Transfer Details (indicate cue type and reason): simulated in room       Tub/Shower Transfer Details (indicate cue type and reason): Educating pt on stepping in shower backwards Functional mobility during ADLs: Min guard;Rolling walker (2 wheels) General ADL Comments: Overall, performing at [facility] guard a level. Limited by pain     Vision         Perception     Praxis      Pertinent Vitals/Pain Pain Assessment Pain Assessment: Faces Faces Pain Scale: Hurts even more Pain Location: back Pain Descriptors / Indicators: Sore, Discomfort, Crying Pain Intervention(s): Monitored during session, Limited activity within patient's tolerance, Repositioned     Hand Dominance Right   Extremity/Trunk Assessment Upper Extremity Assessment Upper Extremity Assessment: Overall WFL for tasks assessed   Lower Extremity Assessment Lower Extremity Assessment: Defer to PT evaluation   Cervical / Trunk Assessment Cervical / Trunk Assessment: Back Surgery   Communication Communication Communication: No difficulties   Cognition Arousal/Alertness: Awake/alert Behavior During Therapy: WFL for tasks assessed/performed Overall Cognitive Status: Impaired/Different from baseline Area of Impairment: Safety/judgement, Following commands, Problem solving, Memory                     Memory: Decreased recall of precautions Following Commands: Follows one step commands with increased  time Safety/Judgement: Decreased awareness of deficits, Decreased awareness of safety   Problem Solving: Slow processing General Comments: Pt with decreased awareness and requiring increased time. Distracted easily by pain.     General Comments  Pt's friend present throughout. Per RN, pt has been getting up to bathroom all day with her friend. Not maintaining NWB status through R; has worn KI all day.    Exercises     Shoulder Instructions      Home Living Family/patient expects to be discharged to:: Private residence Living Arrangements: Alone Available Help at Discharge: Friend(s) (reporting her "best friend" is going to stay with her) Type of Home: House Home Access: Stairs to enter [employer] of Steps: 2   Home Layout: One level     Bathroom Shower/Tub: Producer, television/film/video: Standard     Home Equipment: Agricultural consultant (2 wheels);Cane - single point;Shower seat;BSC/3in1;Adaptive equipment Adaptive Equipment: Reacher;Sock aid        Prior Functioning/Environment Prior Level of Function : Needs assist             Mobility Comments: Pt reporting she has been walking with walker ADLs Comments: Pt reports performing ADLs "has been so hard because of pain" and that her "good friend helps me out when I need it."        OT Problem List: Decreased strength;Decreased range of motion;Decreased activity tolerance;Impaired balance (sitting and/or standing);Decreased knowledge of use of DME or AE;Decreased knowledge of precautions      OT Treatment/Interventions: Self-care/ADL training;Therapeutic exercise;Energy conservation;DME and/or AE instruction;Therapeutic activities;Patient/family education    OT Goals(Current goals can be found in the  care plan section) Acute Rehab OT Goals Patient Stated Goal: Go home OT Goal Formulation: With patient Time For Goal Achievement: 09/25/22 Potential to Achieve Goals: Good  OT Frequency: Min 2X/week     Co-evaluation              AM-PAC OT "6 Clicks" Daily Activity     Outcome Measure Help from another person eating meals?: A Little Help from another person taking care of personal grooming?: A Little Help from another person toileting, which includes using toliet, bedpan, or urinal?: A Little Help from another person bathing (including washing, rinsing, drying)?: A Little Help from another person to put on and taking off regular upper body clothing?: A Little Help from another person to put on and taking off regular lower body clothing?: A Little 6 Click Score: 18   End of Session Equipment Utilized During Treatment: Rolling walker (2 wheels);Back brace Nurse Communication: Mobility status  Activity Tolerance: Patient tolerated treatment well Patient left: in bed;with call bell/phone within reach;with family/visitor present  OT Visit Diagnosis: Other abnormalities of gait and mobility (R26.89);Unsteadiness on feet (R26.81);Muscle weakness (generalized) (M62.81)                Time: 8921-1941 OT Time Calculation (min): 23 min Charges:  OT General Charges $OT Visit: 1 Visit OT Evaluation $OT Eval Low Complexity: 1 Low OT Treatments $Self Care/Home Management : 8-22 mins  Keldrick Pomplun MSOT, OTR/L Acute Rehab Office: [phone]  Theodoro Grist Beulah Matusek 09/11/2022, 5:07 PM

## 2022-09-11 NOTE — Progress Notes (Signed)
OT Cancellation Note  Patient Details Name: Laura Ho MRN: [id number] DOB: 24-Mar-1971   Cancelled Treatment:    Reason Eval/Treat Not Completed: Patient at procedure or test/ unavailable (IV team in room. Per RN, pt requiring bedrest for 30 minutes after IV removal. Will return as schedule allows.)  [place], OTR/L Acute Rehab Office: [phone] 09/11/2022, 8:18 AM

## 2022-09-11 NOTE — Evaluation (Addendum)
Physical Therapy Evaluation Patient Details Name: Laura Ho MRN: [id number] DOB: 02-28-1971 Today's Date: 09/11/2022  History of Present Illness  51 yo female s/p  anterior lateral interbody fusion L1-L2 and redo instrumented posterolateral arthrodesis L5-S1 for pseudoarthrosis, removal dorsal column stimulator on 9/25. PMH includes diverticulitis, former smoker, previous fusion.  Clinical Impression   Pt presents with impaired strength, severe back pain, and decreased activity tolerance. Pt to benefit from acute PT to address deficits. Pt ambulated to/from bathroom during session, as upon PT arrival pt EOB with her friend and attempting to stand without staff in room, pt declines use of back brace to go to bathroom.   Pt has a R knee immobilizer (KI) donned during session, when PT asked pt about this pt states she has a fracture and is supposed to be nonweightbearing but knowingly has not been maintaining this precaution after her back surgery on 9/25. 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 Julius Bowels"  which pt is evidently supposed to be NWB in KI x6 weeks (per [facility] documentation on 09/03/2022, Patrik Carrie Mew, PA-C). PT encouraged pt to maintain all precautions, back and RLE, but pt states she cannot maintain RLE NWB and has been mobilizing to and from bathroom with WB through RLE.  PT to progress mobility as tolerated, and will continue to follow acutely.         Recommendations for follow up therapy are one component of a multi-disciplinary discharge planning process, led by the attending physician.  Recommendations may be updated based on patient status, additional functional criteria and insurance authorization.  Follow Up Recommendations Follow physician's recommendations for discharge plan and follow  up therapies      Assistance Recommended at Discharge Frequent or constant Supervision/Assistance  Patient can return home with the following  A little help with walking and/or transfers;A little help with bathing/dressing/bathroom    Equipment Recommendations None recommended by PT  Recommendations for Other Services       Functional Status Assessment Patient has had a recent decline in their functional status and demonstrates the ability to make significant improvements in function in a reasonable and predictable amount of time.     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 Julius Bowels). Informed her she needs to remain non-weight bearing and stay in knee immobilizer. KI fits well. F/U in 2 weeks for 2 v knee XR.... I told her she will need to remain NWB for 6+ weeks"      Mobility  Bed Mobility Overal bed mobility: Needs Assistance Bed Mobility: Sit to Sidelying         Sit to sidelying: Min assist General bed mobility comments: pt sitting EOB upon PT arrival to room, min assist for LE lifting and log roll safely into bed    Transfers Overall transfer level: Needs assistance Equipment used: Rolling walker (2 wheels) Transfers: Sit to/from Stand Sit to Stand:  Min assist, +2 physical assistance           General transfer comment: min +2 for power up, rise, steadying    Ambulation/Gait Ambulation/Gait  assistance: Min assist Gait Distance (Feet): 10 Feet (x2 - to and from bathroom) Assistive device: Rolling walker (2 wheels) Gait Pattern/deviations: Decreased weight shift to right, Trunk flexed, Step-through pattern, Shuffle Gait velocity: decr     General Gait Details: assist to steady, pt does not maintain NWB precautions RLE states she is unable and has been unable to since spinal surgery. PT encouraged pt to maintain all precautions, back and RLE  Stairs            Wheelchair Mobility    Modified Rankin (Stroke Patients Only)       Balance Overall balance assessment: Needs assistance Sitting-balance support: No upper extremity supported, Feet supported Sitting balance-Leahy Scale: Fair     Standing balance support: Bilateral upper extremity supported, During functional activity, Reliant on assistive device for balance Standing balance-Leahy Scale: Poor                               Pertinent Vitals/Pain Pain Assessment Pain Assessment: 0-10 Pain Score: 10-Worst pain ever Pain Location: back Pain Descriptors / Indicators: Sore, Discomfort, Crying Pain Intervention(s): Limited activity within patient's tolerance, Monitored during session, Repositioned    Home Living Family/patient expects to be discharged to:: Private residence Living Arrangements: Spouse/significant other;Non-relatives/Friends Available Help at Discharge: Family Type of Home: House Home Access: Stairs to enter   Technical brewer of Steps: 2   Home Layout: One level Home Equipment: Conservation officer, nature (2 wheels);Cane - single point;Shower seat;BSC/3in1;Adaptive equipment      Prior Function Prior Level of Function : Needs assist             Mobility Comments: pt reports severe back pain for several weeks, states she has had to wear R KI since 9/18 and is supposed to be NWB x6+ weeks, states she can't maintain it       Hand Dominance   Dominant Hand: Right     Extremity/Trunk Assessment   Upper Extremity Assessment Upper Extremity Assessment: Defer to OT evaluation    Lower Extremity Assessment Lower Extremity Assessment: Generalized weakness    Cervical / Trunk Assessment Cervical / Trunk Assessment: Back Surgery  Communication   Communication: No difficulties  Cognition Arousal/Alertness: Awake/alert Behavior During Therapy: Restless, Anxious Overall Cognitive Status: Impaired/Different from baseline Area of Impairment: Safety/judgement, Following commands, Problem solving, Memory                     Memory: Decreased recall of precautions   Safety/Judgement: Decreased awareness of deficits, Decreased awareness of safety     General Comments: pt crying in pain throughout session, does not follow WB precautions RLE because pt states she is unable to post-op        General Comments      Exercises     Assessment/Plan    PT Assessment Patient needs continued PT services  PT Problem List Decreased strength;Decreased mobility;Decreased safety awareness;Decreased balance;Decreased knowledge of use of DME;Pain;Decreased activity tolerance;Decreased knowledge of precautions       PT Treatment Interventions DME instruction;Therapeutic activities;Gait training;Patient/family education;Therapeutic exercise;Balance training;Stair training;Functional mobility training;Neuromuscular re-education    PT Goals (Current goals can be found in the Care Plan section)  Acute Rehab PT Goals PT Goal Formulation: With patient Time For Goal Achievement:  09/25/22 Potential to Achieve Goals: Fair    Frequency Min 5X/week     Co-evaluation               AM-PAC PT "6 Clicks" Mobility  Outcome Measure Help needed turning from your back to your side while in a flat bed without using bedrails?: A Little Help needed moving from lying on your back to sitting on the side of a flat bed without using bedrails?: A Little Help needed  moving to and from a bed to a chair (including a wheelchair)?: A Little Help needed standing up from a chair using your arms (e.g., wheelchair or bedside chair)?: A Little Help needed to walk in hospital room?: A Lot Help needed climbing 3-5 steps with a railing? : A Lot 6 Click Score: 16    End of Session Equipment Utilized During Treatment: Right knee immobilizer Activity Tolerance: Patient limited by pain Patient left: in bed;with call bell/phone within reach;with family/visitor present;with nursing/sitter in room Nurse Communication: Mobility status;Weight bearing status;Precautions PT Visit Diagnosis: Other abnormalities of gait and mobility (R26.89);Muscle weakness (generalized) (M62.81);Pain Pain - Right/Left:  (low) Pain - part of body:  (back)    Time: 8413-2440 PT Time Calculation (min) (ACUTE ONLY): 15 min   Charges:   PT Evaluation $PT Eval Low Complexity: 1 Low        Mandrell Vangilder S, PT DPT Acute Rehabilitation Services Pager [phone]  Office [phone]   Truddie Coco 09/11/2022, 2:43 PM

## 2022-09-12 MED ORDER — HYDROCODONE-ACETAMINOPHEN 5-325 MG PO TABS: 2.0000 | ORAL_TABLET | ORAL | 0 refills | Status: AC | PRN

## 2022-09-12 MED ORDER — METHOCARBAMOL 750 MG PO TABS: 750.0000 mg | ORAL_TABLET | Freq: Four times a day (QID) | ORAL | 0 refills | Status: AC
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
## 2022-09-12 NOTE — Discharge Summary (Signed)
Physician Discharge Summary  Patient ID: Laura Ho MRN: [id number] DOB/AGE: [birth year] 51 y.o.  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  Pseudoarthrosis L5-S1.      Discharge Diagnoses: same   Discharged Condition: good  Hospital Course: The patient was admitted on 09/10/2022 and taken to the operating room where the patient underwent XLIF L1-2, posterior instrumentation. The patient tolerated the procedure well and was taken to the recovery room and then to the floor in stable condition. The hospital course was routine. There were no complications. The wound remained clean dry and intact. Pt had appropriate back soreness. No complaints of leg pain or new N/T/W. The patient remained afebrile with stable vital signs, and tolerated a regular diet. The patient continued to increase activities, and pain was well controlled with oral pain medications.   Consults: None  Significant Diagnostic Studies:  Results for orders placed or performed during the hospital encounter of 09/10/22  CBC  Result Value Ref Range   WBC 6.1 4.0 - 10.5 K/uL   RBC 3.60 (L) 3.87 - 5.11 MIL/uL   Hemoglobin 11.6 (L) 12.0 - 15.0 g/dL   HCT 37.4 36.0 - 46.0 %   MCV 103.9 (H) 80.0 - 100.0 fL   MCH 32.2 26.0 - 34.0 pg   MCHC 31.0 30.0 - 36.0 g/dL   RDW 14.2 11.5 - 15.5 %   Platelets 276 150 - 400 K/uL   nRBC 0.0 0.0 - 0.2 %  I-STAT 7, (LYTES, BLD GAS, ICA, H+H)  Result Value Ref Range   pH, Arterial 7.343 (L) 7.35 - 7.45   pCO2 arterial 41.3 32 - 48 mmHg   pO2, Arterial 191 (H) 83 - 108 mmHg   Bicarbonate 22.5 20.0 - 28.0 mmol/L   TCO2 24 22 - 32 mmol/L   O2 Saturation 100 %   Acid-base deficit 3.0 (H) 0.0 - 2.0 mmol/L   Sodium 138 135 - 145 mmol/L   Potassium 3.3 (L) 3.5 - 5.1 mmol/L   Calcium, Ion 1.25 1.15 - 1.40 mmol/L   HCT 32.0 (L) 36.0 - 46.0 %   Hemoglobin 10.9 (L) 12.0 - 15.0 g/dL    Sample type ARTERIAL   I-STAT 7, (LYTES, BLD GAS, ICA, H+H)  Result Value Ref Range   pH, Arterial 7.365 7.35 - 7.45   pCO2 arterial 40.6 32 - 48 mmHg   pO2, Arterial 195 (H) 83 - 108 mmHg   Bicarbonate 23.2 20.0 - 28.0 mmol/L   TCO2 24 22 - 32 mmol/L   O2 Saturation 100 %   Acid-base deficit 2.0 0.0 - 2.0 mmol/L   Sodium 138 135 - 145 mmol/L   Potassium 3.3 (L) 3.5 - 5.1 mmol/L   Calcium, Ion 1.23 1.15 - 1.40 mmol/L   HCT 36.0 36.0 - 46.0 %   Hemoglobin 12.2 12.0 - 15.0 g/dL   Sample type ARTERIAL   Type and screen Esto  Result Value Ref Range   ABO/RH(D) A POS    Antibody Screen NEG    Sample Expiration      09/13/2022,2359 Performed at [facility], [address] [place], [place] [address]     DG CHEST PORT 1 VIEW  Result Date: 09/10/2022 CLINICAL DATA:  Central line placement EXAM: PORTABLE CHEST 1 VIEW COMPARISON:  06/27/2005 FINDINGS: Right IJ central venous catheter tip over the SVC. No visible pneumothorax. No acute airspace disease. Normal  cardiac size. Aortic atherosclerosis. Lead or catheter tubing over the lower chest and extending to the upper lumbar region. IMPRESSION: 1. Right-sided central venous catheter tip over the SVC. No pneumothorax 2. Clear lung fields Electronically Signed   By: Donavan Foil M.D.   On: 09/10/2022 17:13   DG Lumbar Spine 2-3 Views  Result Date: 09/10/2022 CLINICAL DATA:  51 year old female with lumbar fusion EXAM: LUMBAR SPINE - 2-3 VIEW; DG C-ARM 1-60 MIN-NO REPORT COMPARISON:  01/07/2013, 04/27/2022 FINDINGS: Intraoperative fluoroscopic spot images during lumbar spine surgery. Sequence of 6 fluoroscopic images demonstrates placement of interbody spacer at L1-L2, previously existing bilateral pedicle screws spanning L2-S1, with revision of the bilateral rod fixation. IMPRESSION: Limited intraoperative fluoroscopic spot images demonstrates revision of prior L2-S1 PLIF/bilateral pedicle screw and rod fixation, now  to include L1-S1 as above. Please refer to the dictated operative report for full details of intraoperative findings and procedure. Electronically Signed   By: Corrie Mckusick D.O.   On: 09/10/2022 15:45   DG C-Arm 1-60 Min-No Report  Result Date: 09/10/2022 CLINICAL DATA:  51 year old female with lumbar fusion EXAM: LUMBAR SPINE - 2-3 VIEW; DG C-ARM 1-60 MIN-NO REPORT COMPARISON:  01/07/2013, 04/27/2022 FINDINGS: Intraoperative fluoroscopic spot images during lumbar spine surgery. Sequence of 6 fluoroscopic images demonstrates placement of interbody spacer at L1-L2, previously existing bilateral pedicle screws spanning L2-S1, with revision of the bilateral rod fixation. IMPRESSION: Limited intraoperative fluoroscopic spot images demonstrates revision of prior L2-S1 PLIF/bilateral pedicle screw and rod fixation, now to include L1-S1 as above. Please refer to the dictated operative report for full details of intraoperative findings and procedure. Electronically Signed   By: Corrie Mckusick D.O.   On: 09/10/2022 15:45   DG C-Arm 1-60 Min-No Report  Result Date: 09/10/2022 CLINICAL DATA:  51 year old female with lumbar fusion EXAM: LUMBAR SPINE - 2-3 VIEW; DG C-ARM 1-60 MIN-NO REPORT COMPARISON:  01/07/2013, 04/27/2022 FINDINGS: Intraoperative fluoroscopic spot images during lumbar spine surgery. Sequence of 6 fluoroscopic images demonstrates placement of interbody spacer at L1-L2, previously existing bilateral pedicle screws spanning L2-S1, with revision of the bilateral rod fixation. IMPRESSION: Limited intraoperative fluoroscopic spot images demonstrates revision of prior L2-S1 PLIF/bilateral pedicle screw and rod fixation, now to include L1-S1 as above. Please refer to the dictated operative report for full details of intraoperative findings and procedure. Electronically Signed   By: Corrie Mckusick D.O.   On: 09/10/2022 15:45   DG C-Arm 1-60 Min-No Report  Result Date: 09/10/2022 CLINICAL DATA:  51 year old  female with lumbar fusion EXAM: LUMBAR SPINE - 2-3 VIEW; DG C-ARM 1-60 MIN-NO REPORT COMPARISON:  01/07/2013, 04/27/2022 FINDINGS: Intraoperative fluoroscopic spot images during lumbar spine surgery. Sequence of 6 fluoroscopic images demonstrates placement of interbody spacer at L1-L2, previously existing bilateral pedicle screws spanning L2-S1, with revision of the bilateral rod fixation. IMPRESSION: Limited intraoperative fluoroscopic spot images demonstrates revision of prior L2-S1 PLIF/bilateral pedicle screw and rod fixation, now to include L1-S1 as above. Please refer to the dictated operative report for full details of intraoperative findings and procedure. Electronically Signed   By: Corrie Mckusick D.O.   On: 09/10/2022 15:45   DG C-Arm 1-60 Min-No Report  Result Date: 09/10/2022 CLINICAL DATA:  51 year old female with lumbar fusion EXAM: LUMBAR SPINE - 2-3 VIEW; DG C-ARM 1-60 MIN-NO REPORT COMPARISON:  01/07/2013, 04/27/2022 FINDINGS: Intraoperative fluoroscopic spot images during lumbar spine surgery. Sequence of 6 fluoroscopic images demonstrates placement of interbody spacer at L1-L2, previously existing bilateral pedicle screws spanning L2-S1, with revision of  the bilateral rod fixation. IMPRESSION: Limited intraoperative fluoroscopic spot images demonstrates revision of prior L2-S1 PLIF/bilateral pedicle screw and rod fixation, now to include L1-S1 as above. Please refer to the dictated operative report for full details of intraoperative findings and procedure. Electronically Signed   By: Corrie Mckusick D.O.   On: 09/10/2022 15:45   DG C-Arm 1-60 Min-No Report  Result Date: 09/10/2022 Fluoroscopy was utilized by the requesting physician.  No radiographic interpretation.    Antibiotics:  Anti-infectives (From admission, onward)    Start     Dose/Rate Route Frequency Ordered Stop   09/10/22 2330  ceFAZolin (ANCEF) IVPB 2g/100 mL premix        2 g 200 mL/hr over 30 Minutes Intravenous Every  8 hours 09/10/22 1856 09/11/22 0732   09/10/22 0856  ceFAZolin (ANCEF) 2-4 GM/100ML-% IVPB       Note to Pharmacy: Wendall Mola M: cabinet override      09/10/22 0856 09/10/22 1145   09/10/22 0845  ceFAZolin (ANCEF) IVPB 2g/100 mL premix        2 g 200 mL/hr over 30 Minutes Intravenous On call to O.R. 09/10/22 0832 09/10/22 1600       Discharge Exam: Blood pressure 113/78, pulse 100, temperature 98.8 F (37.1 C), temperature source Oral, resp. rate 16, height _0  (1.676 m), weight 60.3 kg, SpO2 97 %. Neurologic: Grossly normal Ambualtign and voiding well incision cdi   Discharge Medications:   Allergies as of 09/12/2022       Reactions   Bee Venom Anaphylaxis, Shortness Of Breath, Hives   Coconut Oil Itching   Sulfa Antibiotics Anaphylaxis   Biaxin [clarithromycin]    hives   Ciprofloxacin Diarrhea   Other reaction(s): Abdominal Pain        Medication List     TAKE these medications    acetaminophen 500 MG tablet Commonly known as: TYLENOL Take 1,000 mg by mouth every 6 (six) hours as needed for mild pain or moderate pain.   BIOTIN PO Take 12,000 mcg by mouth daily. 6000 mcg each   DULoxetine 60 MG capsule Commonly known as: CYMBALTA Take 60 mg by mouth 2 (two) times daily.   HYDROcodone-acetaminophen 5-325 MG tablet Commonly known as: NORCO/VICODIN Take 2 tablets by mouth every 4 (four) hours as needed for severe pain ((score 7 to 10)).   hydrOXYzine 25 MG capsule Commonly known as: VISTARIL Take 25 mg by mouth every 6 (six) hours as needed for anxiety.   levocetirizine 5 MG tablet Commonly known as: XYZAL Take 5 mg by mouth at bedtime.   methocarbamol 750 MG tablet Commonly known as: Robaxin-750 Take 1 tablet (750 mg total) by mouth 4 (four) times daily.   mirabegron ER 50 MG Tb24 tablet Commonly known as: MYRBETRIQ Take 50 mg by mouth at bedtime.   montelukast 10 MG tablet Commonly known as: SINGULAIR Take 10 mg by mouth at bedtime.    morphine 15 MG tablet Commonly known as: MSIR Take 15 mg by mouth 4 (four) times daily.   morphine 15 MG 12 hr tablet Commonly known as: MS CONTIN Take 15 mg by mouth 3 (three) times daily.   Narcan 4 MG/0.1ML Liqd nasal spray kit Generic drug: naloxone 4 mg.   omeprazole 40 MG capsule Commonly known as: PRILOSEC Take 40 mg by mouth at bedtime.   ondansetron 4 MG disintegrating tablet Commonly known as: Zofran ODT Take 1 tablet (4 mg total) by mouth every 8 (eight) hours as needed  for nausea. What changed: when to take this   tiZANidine 4 MG tablet Commonly known as: ZANAFLEX Take 4 mg by mouth every 8 (eight) hours as needed for muscle spasms.   topiramate 200 MG tablet Commonly known as: TOPAMAX Take 200 mg by mouth daily.   traZODone 50 MG tablet Commonly known as: DESYREL Take 150-200 mg by mouth at bedtime as needed for sleep.   Vitamin D-3 125 MCG (5000 UT) Tabs Take 5,000 Units by mouth daily.        Disposition: home   Final Dx: XLIF L1-2, posterior instrumentation, redo posterior lateral fusion L5-S1  Discharge Instructions      Remove dressing in 72 hours   Complete by: As directed    Call MD for:  difficulty breathing, headache or visual disturbances   Complete by: As directed    Call MD for:  hives   Complete by: As directed    Call MD for:  persistant dizziness or light-headedness   Complete by: As directed    Call MD for:  persistant nausea and vomiting   Complete by: As directed    Call MD for:  redness, tenderness, or signs of infection (pain, swelling, redness, odor or green/yellow discharge around incision site)   Complete by: As directed    Call MD for:  severe uncontrolled pain   Complete by: As directed    Call MD for:  temperature >100.4   Complete by: As directed    Diet - low sodium heart healthy   Complete by: As directed    Driving Restrictions   Complete by: As directed    No driving for 2 weeks, no riding in the car for 1  week   Increase activity slowly   Complete by: As directed    Lifting restrictions   Complete by: As directed    No lifting more than 8 lbs        Follow-up Information     Laura Follow up.   Why: The home health agency will contact you for the first home visit Contact information: [phone]                 Signed: Ocie Cornfield [facility] 09/12/2022, 4:50 PM

## 2022-09-12 NOTE — Progress Notes (Signed)
Subjective: Patient reports  condition of back pain but otherwise no leg pain  Objective: Vital signs in last 24 hours: Temp:  [97.8 F (36.6 C)-98.8 F (37.1 C)] 98.8 F (37.1 C) (09/27 0811) Pulse Rate:  [91-100] 100 (09/27 0811) Resp:  [16-20] 16 (09/27 0811) BP: (96-131)/(60-84) 113/78 (09/27 0811) SpO2:  [95 %-98 %] 97 % (09/27 0811)  Intake/Output from previous day: 09/26 0701 - 09/27 0700 In: -  Out: 10 [Drains:10] Intake/Output this shift: No intake/output data recorded.  Doing well strength 5 out of 5 incision clean dry intact  Lab Results: Recent Labs    09/10/22 0855 09/10/22 1447 09/10/22 1550  WBC 6.1  --   --   HGB 11.6* 10.9* 12.2  HCT 37.4 32.0* 36.0  PLT 276  --   --    BMET Recent Labs    09/10/22 1447 09/10/22 1550  NA 138 138  K 3.3* 3.3*    Studies/Results: DG CHEST PORT 1 VIEW  Result Date: 09/10/2022 CLINICAL DATA:  Central line placement EXAM: PORTABLE CHEST 1 VIEW COMPARISON:  06/27/2005 FINDINGS: Right IJ central venous catheter tip over the SVC. No visible pneumothorax. No acute airspace disease. Normal cardiac size. Aortic atherosclerosis. Lead or catheter tubing over the lower chest and extending to the upper lumbar region. IMPRESSION: 1. Right-sided central venous catheter tip over the SVC. No pneumothorax 2. Clear lung fields Electronically Signed   By: Donavan Foil M.D.   On: 09/10/2022 17:13   DG Lumbar Spine 2-3 Views  Result Date: 09/10/2022 CLINICAL DATA:  51 year old female with lumbar fusion EXAM: LUMBAR SPINE - 2-3 VIEW; DG C-ARM 1-60 MIN-NO REPORT COMPARISON:  01/07/2013, 04/27/2022 FINDINGS: Intraoperative fluoroscopic spot images during lumbar spine surgery. Sequence of 6 fluoroscopic images demonstrates placement of interbody spacer at L1-L2, previously existing bilateral pedicle screws spanning L2-S1, with revision of the bilateral rod fixation. IMPRESSION: Limited intraoperative fluoroscopic spot images demonstrates  revision of prior L2-S1 PLIF/bilateral pedicle screw and rod fixation, now to include L1-S1 as above. Please refer to the dictated operative report for full details of intraoperative findings and procedure. Electronically Signed   By: Corrie Mckusick D.O.   On: 09/10/2022 15:45   DG C-Arm 1-60 Min-No Report  Result Date: 09/10/2022 CLINICAL DATA:  51 year old female with lumbar fusion EXAM: LUMBAR SPINE - 2-3 VIEW; DG C-ARM 1-60 MIN-NO REPORT COMPARISON:  01/07/2013, 04/27/2022 FINDINGS: Intraoperative fluoroscopic spot images during lumbar spine surgery. Sequence of 6 fluoroscopic images demonstrates placement of interbody spacer at L1-L2, previously existing bilateral pedicle screws spanning L2-S1, with revision of the bilateral rod fixation. IMPRESSION: Limited intraoperative fluoroscopic spot images demonstrates revision of prior L2-S1 PLIF/bilateral pedicle screw and rod fixation, now to include L1-S1 as above. Please refer to the dictated operative report for full details of intraoperative findings and procedure. Electronically Signed   By: Corrie Mckusick D.O.   On: 09/10/2022 15:45   DG C-Arm 1-60 Min-No Report  Result Date: 09/10/2022 CLINICAL DATA:  51 year old female with lumbar fusion EXAM: LUMBAR SPINE - 2-3 VIEW; DG C-ARM 1-60 MIN-NO REPORT COMPARISON:  01/07/2013, 04/27/2022 FINDINGS: Intraoperative fluoroscopic spot images during lumbar spine surgery. Sequence of 6 fluoroscopic images demonstrates placement of interbody spacer at L1-L2, previously existing bilateral pedicle screws spanning L2-S1, with revision of the bilateral rod fixation. IMPRESSION: Limited intraoperative fluoroscopic spot images demonstrates revision of prior L2-S1 PLIF/bilateral pedicle screw and rod fixation, now to include L1-S1 as above. Please refer to the dictated operative report for full details of  intraoperative findings and procedure. Electronically Signed   By: Gilmer Mor D.O.   On: 09/10/2022 15:45   DG C-Arm  1-60 Min-No Report  Result Date: 09/10/2022 CLINICAL DATA:  51 year old female with lumbar fusion EXAM: LUMBAR SPINE - 2-3 VIEW; DG C-ARM 1-60 MIN-NO REPORT COMPARISON:  01/07/2013, 04/27/2022 FINDINGS: Intraoperative fluoroscopic spot images during lumbar spine surgery. Sequence of 6 fluoroscopic images demonstrates placement of interbody spacer at L1-L2, previously existing bilateral pedicle screws spanning L2-S1, with revision of the bilateral rod fixation. IMPRESSION: Limited intraoperative fluoroscopic spot images demonstrates revision of prior L2-S1 PLIF/bilateral pedicle screw and rod fixation, now to include L1-S1 as above. Please refer to the dictated operative report for full details of intraoperative findings and procedure. Electronically Signed   By: Gilmer Mor D.O.   On: 09/10/2022 15:45   DG C-Arm 1-60 Min-No Report  Result Date: 09/10/2022 CLINICAL DATA:  51 year old female with lumbar fusion EXAM: LUMBAR SPINE - 2-3 VIEW; DG C-ARM 1-60 MIN-NO REPORT COMPARISON:  01/07/2013, 04/27/2022 FINDINGS: Intraoperative fluoroscopic spot images during lumbar spine surgery. Sequence of 6 fluoroscopic images demonstrates placement of interbody spacer at L1-L2, previously existing bilateral pedicle screws spanning L2-S1, with revision of the bilateral rod fixation. IMPRESSION: Limited intraoperative fluoroscopic spot images demonstrates revision of prior L2-S1 PLIF/bilateral pedicle screw and rod fixation, now to include L1-S1 as above. Please refer to the dictated operative report for full details of intraoperative findings and procedure. Electronically Signed   By: Gilmer Mor D.O.   On: 09/10/2022 15:45   DG C-Arm 1-60 Min-No Report  Result Date: 09/10/2022 Fluoroscopy was utilized by the requesting physician.  No radiographic interpretation.    Assessment/Plan: Continue to mobilize with physical Occupational Therapy some question as to whether the patient is weightbearing or not on the knee  that reportedly has a fracture in it however she was ambulatory before she came to the hospital so she has a knee brace that she can wear and mobilize.  LOS: 2 days     Mariam Dollar 09/12/2022, 1:44 PM

## 2022-09-12 NOTE — Progress Notes (Signed)
Removed Dilaudid 0.5 mg vial in the pyxis to be given to Laura Ho for severe pain but BP was soft 96/60,wasted the whole vial Dilaudid 0.5 mg  in the stericycle. Witnessed by Janeann Merl RN.

## 2022-09-13 LAB — TYPE AND SCREEN
ABO/RH(D): A POS
Antibody Screen: POSITIVE
Donor AG Type: NEGATIVE
Donor AG Type: NEGATIVE
Unit division: 0
Unit division: 0

## 2022-09-13 LAB — BPAM RBC
Blood Product Expiration Date: 202310172359
Blood Product Expiration Date: 202310172359
Unit Type and Rh: 6200
Unit Type and Rh: 6200

## 2022-09-14 ENCOUNTER — Encounter (HOSPITAL_COMMUNITY): Payer: Self-pay | Admitting: Neurosurgery

## 2022-09-22 ENCOUNTER — Encounter (HOSPITAL_COMMUNITY): Payer: Self-pay | Admitting: Neurosurgery

## 2024-11-17 ENCOUNTER — Other Ambulatory Visit (HOSPITAL_COMMUNITY): Payer: Self-pay | Admitting: Orthopedic Surgery

## 2024-11-17 DIAGNOSIS — M25511 Pain in right shoulder: Secondary | ICD-10-CM

## 2024-12-18 ENCOUNTER — Other Ambulatory Visit (HOSPITAL_COMMUNITY): Payer: Self-pay | Admitting: Orthopedic Surgery

## 2024-12-18 DIAGNOSIS — M25511 Pain in right shoulder: Secondary | ICD-10-CM

## 2024-12-24 ENCOUNTER — Other Ambulatory Visit (HOSPITAL_COMMUNITY)

## 2024-12-24 ENCOUNTER — Ambulatory Visit (HOSPITAL_COMMUNITY): Admission: RE | Admit: 2024-12-24 | Source: Ambulatory Visit

## 2024-12-30 ENCOUNTER — Other Ambulatory Visit (HOSPITAL_COMMUNITY)

## 2024-12-30 ENCOUNTER — Encounter (HOSPITAL_COMMUNITY): Payer: Self-pay

## 2025-01-21 ENCOUNTER — Encounter (HOSPITAL_COMMUNITY): Payer: Self-pay | Admitting: Orthopedic Surgery

## 2025-01-28 ENCOUNTER — Other Ambulatory Visit (HOSPITAL_COMMUNITY)

## 2025-01-28 ENCOUNTER — Ambulatory Visit (HOSPITAL_COMMUNITY): Admission: RE | Admit: 2025-01-28 | Source: Ambulatory Visit
